# Patient Record
Sex: Male | Born: 1975 | Race: White | Hispanic: No | Marital: Married | Smoking: Never smoker
Health system: Southern US, Community
[De-identification: ages and names within clinical notes are randomized; demographics above are authoritative.]

## PROBLEM LIST (undated history)

## (undated) DIAGNOSIS — I639 Cerebral infarction, unspecified: Secondary | ICD-10-CM

## (undated) DIAGNOSIS — Z8489 Family history of other specified conditions: Secondary | ICD-10-CM

## (undated) DIAGNOSIS — I7771 Dissection of carotid artery: Secondary | ICD-10-CM

## (undated) DIAGNOSIS — Q249 Congenital malformation of heart, unspecified: Secondary | ICD-10-CM

## (undated) DIAGNOSIS — M1990 Unspecified osteoarthritis, unspecified site: Secondary | ICD-10-CM

## (undated) DIAGNOSIS — M069 Rheumatoid arthritis, unspecified: Secondary | ICD-10-CM

## (undated) DIAGNOSIS — E785 Hyperlipidemia, unspecified: Secondary | ICD-10-CM

## (undated) HISTORY — DX: Unspecified osteoarthritis, unspecified site: M19.90

---

## 2014-10-12 ENCOUNTER — Encounter (HOSPITAL_COMMUNITY): Payer: Self-pay | Admitting: *Deleted

## 2014-10-12 ENCOUNTER — Emergency Department (HOSPITAL_COMMUNITY)
Admission: EM | Admit: 2014-10-12 | Discharge: 2014-10-12 | Disposition: A | Discharge status: Home / Self Care | Payer: 59 | Source: Home / Self Care | Attending: Family Medicine | Admitting: Family Medicine

## 2014-10-12 DIAGNOSIS — R29898 Other symptoms and signs involving the musculoskeletal system: Secondary | ICD-10-CM | POA: Diagnosis not present

## 2014-10-12 DIAGNOSIS — R200 Anesthesia of skin: Secondary | ICD-10-CM | POA: Diagnosis not present

## 2014-10-12 NOTE — ED Provider Notes (Signed)
CSN: [id number]     Arrival date & time 10/12/14  8469 History   First MD Initiated Contact with Patient 10/12/14 [phone]     Chief Complaint  Patient presents with  . Arm Swelling   (Consider location/radiation/quality/duration/timing/severity/associated sxs/prior Treatment) HPI             39 year old male with no past medical history, on the medications, presents for evaluation of possible right arm injury. On Monday he noticed a small knot in his right distal biceps. Yesterday the area started to look bruised. This morning he woke up and his hand was ice cold and his fingers were numb, and he cannot move his hand. Exam is not as cold but his fingers are still numb. He still cannot move his fingers very much. Denies any injury. There is very little pain associated with this. Denies any heavy lifting  History reviewed. No pertinent past medical history. History reviewed. No pertinent past surgical history. History reviewed. No pertinent family history. History  Substance Use Topics  . Smoking status: Never Smoker   . Smokeless tobacco: Not on file  . Alcohol Use: No    Review of Systems  Musculoskeletal:       See history of present illness  Skin: Positive for color change.  Neurological: Positive for weakness and numbness.  All other systems reviewed and are negative.   Allergies  Review of patient's allergies indicates not on file.  Home Medications   Prior to Admission medications   Not on File   BP 133/85 mmHg  Pulse 75  Temp(Src) 97.9 F (36.6 C) (Oral)  Resp 12  SpO2 97% Physical Exam  Constitutional: He is oriented to person, place, and time. He appears well-developed and well-nourished. No distress.  HENT:  Head: Normocephalic.  Cardiovascular:  Pulses:      Radial pulses are 2+ on the right side.  Ulnar artery pulses normal   Pulmonary/Chest: Effort normal. No respiratory distress.  Musculoskeletal:       Right hand: He exhibits no tenderness, normal  capillary refill and no swelling. Decreased sensation (decreased sensation in lateral 3 fingers ) noted. Decreased strength noted. He exhibits thumb/finger opposition. He exhibits no finger abduction and no wrist extension trouble.  Decreased wrist flexion, decreased grip strength.  Ecchymosis over distal biceps, 4 cm diameter  Neurological: He is alert and oriented to person, place, and time. Coordination normal.  Skin: Skin is warm and dry. No rash noted. He is not diaphoretic.  Psychiatric: He has a normal mood and affect. Judgment normal.  Nursing note and vitals reviewed.   ED Course  Procedures (including critical care time) Labs Review Labs Reviewed - No data to display  Imaging Review No results found.  US performed by Dr. Denyse Amass reveals no obvious abnormality   MDM   1. Arm weakness   2. Finger numbness    Discussed this patient with the on-call hand surgeon who would like to see the patient in follow-up clinic. The patient is amenable to this plan. He may follow-up if worsening between then and now    Graylon Good, PA-C 10/12/14 1027

## 2014-10-12 NOTE — Discharge Instructions (Signed)
Please call Dr. Glenna Durand office to schedule a follow-up appointment when you leave here today  Paresthesia Paresthesia is an abnormal burning or prickling sensation. This sensation is generally felt in the hands, arms, legs, or feet. However, it may occur in any part of the body. It is usually not painful. The feeling may be described as:  Tingling or numbness.  "Pins and needles."  Skin crawling.  Buzzing.  Limbs "falling asleep."  Itching. Most people experience temporary (transient) paresthesia at some time in their lives. CAUSES  Paresthesia may occur when you breathe too quickly (hyperventilation). It can also occur without any apparent cause. Commonly, paresthesia occurs when pressure is placed on a nerve. The feeling quickly goes away once the pressure is removed. For some people, however, paresthesia is a long-lasting (chronic) condition caused by an underlying disorder. The underlying disorder may be:  A traumatic, direct injury to nerves. Examples include a:  Broken (fractured) neck.  Fractured skull.  A disorder affecting the brain and spinal cord (central nervous system). Examples include:  Transverse myelitis.  Encephalitis.  Transient ischemic attack.  Multiple sclerosis.  Stroke.  Tumor or blood vessel problems, such as an arteriovenous malformation pressing against the brain or spinal cord.  A condition that damages the peripheral nerves (peripheral neuropathy). Peripheral nerves are not part of the brain and spinal cord. These conditions include:  Diabetes.  Peripheral vascular disease.  Nerve entrapment syndromes, such as carpal tunnel syndrome.  Shingles.  Hypothyroidism.  Vitamin B12 deficiencies.  Alcoholism.  Heavy metal poisoning (lead, arsenic).  Rheumatoid arthritis.  Systemic lupus erythematosus. DIAGNOSIS  Your caregiver will attempt to find the underlying cause of your paresthesia. Your caregiver may:  Take your medical  history.  Perform a physical exam.  Order various lab tests.  Order imaging tests. TREATMENT  Treatment for paresthesia depends on the underlying cause. HOME CARE INSTRUCTIONS  Avoid drinking alcohol.  You may consider massage or acupuncture to help relieve your symptoms.  Keep all follow-up appointments as directed by your caregiver. SEEK IMMEDIATE MEDICAL CARE IF:   You feel weak.  You have trouble walking or moving.  You have problems with speech or vision.  You feel confused.  You cannot control your bladder or bowel movements.  You feel numbness after an injury.  You faint.  Your burning or prickling feeling gets worse when walking.  You have pain, cramps, or dizziness.  You develop a rash. MAKE SURE YOU:  Understand these instructions.  Will watch your condition.  Will get help right away if you are not doing well or get worse. Document Released: 07/05/2002 Document Revised: 10/07/2011 Document Reviewed: 04/05/2011 [facility] Patient Information 2015 [place], [place]. This information is not intended to replace advice given to you by your health care provider. Make sure you discuss any questions you have with your health care provider.

## 2014-10-12 NOTE — ED Notes (Signed)
Pt  Reports noticed  A  Knot  r    Forearm   2  Days   Ago   -  Yesterday  He  Noticed     Bruising   And  Swelling    -  He  denys  Any  Injury       He     Reports    Some  Tingling in  The  Affected  Hand   He  Has  Good  Radial  Pulse     -      He      Is  Sitting  Upright on  The  Exam  Table  Speaking  In  Complete  sentances

## 2014-10-12 NOTE — ED Provider Notes (Signed)
Limited ultrasound of the right flexor elbow: Area of ecchymosis is ultrasound without any obvious myofascial disruption. The distal biceps tendon was seen and was appeared to be intact. The distal insertion was not visualized. The neurovascular bundle was seen and had Doppler flow on PDI. The median nerve was identified and appeared to be intact.   Rodolph Bong, MD 10/12/14 1009

## 2015-07-18 ENCOUNTER — Encounter: Payer: Self-pay | Admitting: Internal Medicine

## 2015-07-18 ENCOUNTER — Ambulatory Visit: Payer: 59 | Admitting: Internal Medicine

## 2015-07-18 DIAGNOSIS — M069 Rheumatoid arthritis, unspecified: Secondary | ICD-10-CM

## 2015-07-18 DIAGNOSIS — R7612 Nonspecific reaction to cell mediated immunity measurement of gamma interferon antigen response without active tuberculosis: Secondary | ICD-10-CM | POA: Insufficient documentation

## 2015-07-18 HISTORY — DX: Rheumatoid arthritis, unspecified: M06.9

## 2016-04-04 ENCOUNTER — Other Ambulatory Visit: Payer: Self-pay

## 2016-04-04 ENCOUNTER — Emergency Department (HOSPITAL_COMMUNITY): Payer: 59

## 2016-04-04 ENCOUNTER — Inpatient Hospital Stay (HOSPITAL_COMMUNITY)
Admission: EM | Admit: 2016-04-04 | Discharge: 2016-04-05 | DRG: 064 | Disposition: A | Discharge status: Home / Self Care | Payer: 59 | Attending: Internal Medicine | Admitting: Internal Medicine

## 2016-04-04 ENCOUNTER — Encounter (HOSPITAL_COMMUNITY): Payer: Self-pay

## 2016-04-04 DIAGNOSIS — I639 Cerebral infarction, unspecified: Secondary | ICD-10-CM | POA: Insufficient documentation

## 2016-04-04 DIAGNOSIS — Q249 Congenital malformation of heart, unspecified: Secondary | ICD-10-CM

## 2016-04-04 DIAGNOSIS — R402142 Coma scale, eyes open, spontaneous, at arrival to emergency department: Secondary | ICD-10-CM | POA: Diagnosis present

## 2016-04-04 DIAGNOSIS — Z79899 Other long term (current) drug therapy: Secondary | ICD-10-CM

## 2016-04-04 DIAGNOSIS — R202 Paresthesia of skin: Secondary | ICD-10-CM | POA: Diagnosis present

## 2016-04-04 DIAGNOSIS — Z823 Family history of stroke: Secondary | ICD-10-CM

## 2016-04-04 DIAGNOSIS — E669 Obesity, unspecified: Secondary | ICD-10-CM | POA: Diagnosis present

## 2016-04-04 DIAGNOSIS — I6789 Other cerebrovascular disease: Secondary | ICD-10-CM | POA: Diagnosis not present

## 2016-04-04 DIAGNOSIS — I63412 Cerebral infarction due to embolism of left middle cerebral artery: Secondary | ICD-10-CM | POA: Diagnosis present

## 2016-04-04 DIAGNOSIS — E785 Hyperlipidemia, unspecified: Secondary | ICD-10-CM | POA: Diagnosis present

## 2016-04-04 DIAGNOSIS — R402362 Coma scale, best motor response, obeys commands, at arrival to emergency department: Secondary | ICD-10-CM | POA: Diagnosis present

## 2016-04-04 DIAGNOSIS — R4701 Aphasia: Secondary | ICD-10-CM | POA: Diagnosis present

## 2016-04-04 DIAGNOSIS — R29701 NIHSS score 1: Secondary | ICD-10-CM | POA: Diagnosis present

## 2016-04-04 DIAGNOSIS — R402242 Coma scale, best verbal response, confused conversation, at arrival to emergency department: Secondary | ICD-10-CM | POA: Diagnosis present

## 2016-04-04 DIAGNOSIS — Z6835 Body mass index (BMI) 35.0-35.9, adult: Secondary | ICD-10-CM | POA: Diagnosis not present

## 2016-04-04 DIAGNOSIS — M05711 Rheumatoid arthritis with rheumatoid factor of right shoulder without organ or systems involvement: Secondary | ICD-10-CM

## 2016-04-04 DIAGNOSIS — M069 Rheumatoid arthritis, unspecified: Secondary | ICD-10-CM | POA: Diagnosis present

## 2016-04-04 DIAGNOSIS — I7771 Dissection of carotid artery: Secondary | ICD-10-CM | POA: Diagnosis present

## 2016-04-04 DIAGNOSIS — I454 Nonspecific intraventricular block: Secondary | ICD-10-CM | POA: Diagnosis present

## 2016-04-04 DIAGNOSIS — Q211 Atrial septal defect: Secondary | ICD-10-CM | POA: Diagnosis not present

## 2016-04-04 HISTORY — DX: Congenital malformation of heart, unspecified: Q24.9

## 2016-04-04 HISTORY — DX: Hyperlipidemia, unspecified: E78.5

## 2016-04-04 HISTORY — DX: Cerebral infarction, unspecified: I63.9

## 2016-04-04 HISTORY — DX: Rheumatoid arthritis, unspecified: M06.9

## 2016-04-04 HISTORY — DX: Dissection of carotid artery: I77.71

## 2016-04-04 LAB — CBC
HEMATOCRIT: 46.8 % (ref 39.0–52.0)
Hemoglobin: 16.1 g/dL (ref 13.0–17.0)
MCH: 31.8 pg (ref 26.0–34.0)
MCHC: 34.4 g/dL (ref 30.0–36.0)
MCV: 92.3 fL (ref 78.0–100.0)
PLATELETS: 258 10*3/uL (ref 150–400)
RBC: 5.07 MIL/uL (ref 4.22–5.81)
RDW: 13.2 % (ref 11.5–15.5)
WBC: 7.4 10*3/uL (ref 4.0–10.5)

## 2016-04-04 LAB — I-STAT CHEM 8, ED
BUN: 18 mg/dL (ref 6–20)
CALCIUM ION: 1.12 mmol/L — AB (ref 1.15–1.40)
Chloride: 108 mmol/L (ref 101–111)
Creatinine, Ser: 1 mg/dL (ref 0.61–1.24)
GLUCOSE: 108 mg/dL — AB (ref 65–99)
HCT: 47 % (ref 39.0–52.0)
Hemoglobin: 16 g/dL (ref 13.0–17.0)
Potassium: 4.3 mmol/L (ref 3.5–5.1)
SODIUM: 143 mmol/L (ref 135–145)
TCO2: 23 mmol/L (ref 0–100)

## 2016-04-04 LAB — COMPREHENSIVE METABOLIC PANEL
ALK PHOS: 57 U/L (ref 38–126)
ALT: 91 U/L — AB (ref 17–63)
AST: 42 U/L — ABNORMAL HIGH (ref 15–41)
Albumin: 3.9 g/dL (ref 3.5–5.0)
Anion gap: 10 (ref 5–15)
BILIRUBIN TOTAL: 0.4 mg/dL (ref 0.3–1.2)
BUN: 15 mg/dL (ref 6–20)
CALCIUM: 9.2 mg/dL (ref 8.9–10.3)
CO2: 22 mmol/L (ref 22–32)
CREATININE: 1.07 mg/dL (ref 0.61–1.24)
Chloride: 109 mmol/L (ref 101–111)
Glucose, Bld: 114 mg/dL — ABNORMAL HIGH (ref 65–99)
Potassium: 4.4 mmol/L (ref 3.5–5.1)
SODIUM: 141 mmol/L (ref 135–145)
TOTAL PROTEIN: 7 g/dL (ref 6.5–8.1)

## 2016-04-04 LAB — RAPID URINE DRUG SCREEN, HOSP PERFORMED
AMPHETAMINES: NOT DETECTED
Barbiturates: NOT DETECTED
Benzodiazepines: NOT DETECTED
Cocaine: NOT DETECTED
OPIATES: NOT DETECTED
Tetrahydrocannabinol: NOT DETECTED

## 2016-04-04 LAB — TROPONIN I
Troponin I: <0.03 ng/mL (ref <0.03)
Troponin I: <0.03 ng/mL (ref <0.03)

## 2016-04-04 LAB — PROTIME-INR
INR: 1
INR: 1.07
Prothrombin Time: 13.2 seconds (ref 11.4–15.2)
Prothrombin Time: 13.9 seconds (ref 11.4–15.2)

## 2016-04-04 LAB — ETHANOL: Alcohol, Ethyl (B): <5 mg/dL (ref <5)

## 2016-04-04 LAB — URINALYSIS, ROUTINE W REFLEX MICROSCOPIC
BILIRUBIN URINE: NEGATIVE
Glucose, UA: NEGATIVE mg/dL
Hgb urine dipstick: NEGATIVE
KETONES UR: NEGATIVE mg/dL
LEUKOCYTES UA: NEGATIVE
NITRITE: NEGATIVE
Protein, ur: NEGATIVE mg/dL
SPECIFIC GRAVITY, URINE: 1.01 (ref 1.005–1.030)
pH: 6 (ref 5.0–8.0)

## 2016-04-04 LAB — DIFFERENTIAL
Basophils Absolute: 0.1 10*3/uL (ref 0.0–0.1)
Basophils Relative: 1 %
EOS PCT: 3 %
Eosinophils Absolute: 0.2 10*3/uL (ref 0.0–0.7)
LYMPHS ABS: 2.8 10*3/uL (ref 0.7–4.0)
LYMPHS PCT: 38 %
MONO ABS: 0.7 10*3/uL (ref 0.1–1.0)
MONOS PCT: 9 %
NEUTROS ABS: 3.6 10*3/uL (ref 1.7–7.7)
Neutrophils Relative %: 49 %

## 2016-04-04 LAB — ANTITHROMBIN III: ANTITHROMB III FUNC: 81 % (ref 75–120)

## 2016-04-04 LAB — SEDIMENTATION RATE: SED RATE: 3 mm/h (ref 0–16)

## 2016-04-04 LAB — APTT: aPTT: 29 seconds (ref 24–36)

## 2016-04-04 LAB — I-STAT TROPONIN, ED: TROPONIN I, POC: 0 ng/mL (ref 0.00–0.08)

## 2016-04-04 MED ORDER — STROKE: EARLY STAGES OF RECOVERY BOOK
Freq: Once | Status: DC
  Filled 2016-04-04 (×2): qty 1

## 2016-04-04 MED ORDER — SENNOSIDES-DOCUSATE SODIUM 8.6-50 MG PO TABS: 1.0000 | ORAL_TABLET | Freq: Every evening | ORAL | Status: DC | PRN

## 2016-04-04 MED ORDER — IOPAMIDOL (ISOVUE-300) INJECTION 61%
INTRAVENOUS | Status: DC
Start: 2016-04-04 — End: 2016-04-04
  Filled 2016-04-04: qty 150

## 2016-04-04 MED ORDER — NITROGLYCERIN 1 MG/10 ML FOR IR/CATH LAB
INTRA_ARTERIAL | Status: AC
  Filled 2016-04-04: qty 10

## 2016-04-04 MED ORDER — LIDOCAINE HCL 1 % IJ SOLN
INTRAMUSCULAR | Status: AC
  Filled 2016-04-04: qty 20

## 2016-04-04 MED ORDER — IOPAMIDOL (ISOVUE-370) INJECTION 76%
INTRAVENOUS | Status: AC
  Administered 2016-04-04: 40 mL via INTRAVENOUS
  Filled 2016-04-04: qty 100

## 2016-04-04 MED ORDER — SODIUM CHLORIDE 0.9 % IV SOLN
INTRAVENOUS | Status: DC
  Administered 2016-04-04: 12:00:00 via INTRAVENOUS

## 2016-04-04 MED ORDER — ASPIRIN EC 325 MG PO TBEC
325.0000 mg | DELAYED_RELEASE_TABLET | Freq: Every day | ORAL | Status: DC
  Administered 2016-04-04 – 2016-04-05 (×2): 325 mg via ORAL
  Filled 2016-04-04 (×2): qty 1

## 2016-04-04 MED ORDER — ENOXAPARIN SODIUM 40 MG/0.4ML ~~LOC~~ SOLN
40.0000 mg | SUBCUTANEOUS | Status: DC
  Administered 2016-04-04 – 2016-04-05 (×2): 40 mg via SUBCUTANEOUS
  Filled 2016-04-04 (×2): qty 0.4

## 2016-04-04 NOTE — Progress Notes (Addendum)
Pt c/o increase numbness and decrease sensation of his right arm. Strength normal/strong. VSS. MD paged of concern. No other distress voice.  Neuro called back and for RN to report if sx doesn't go away in an hour.   Sim Boast, RN

## 2016-04-04 NOTE — Consult Note (Addendum)
Chief Complaint: code stroke  History obtained from:  Patient     HPI:                                                                                                                                         Hunter Wilson is an  40 y.o. male with hx of RA on humara who presents with waking up with R arm tingling and numbness followed by word find difficulties.  Date last known well: 04/03/16 Time last known well: last night before bed tPA Given: No: wake up stroke  Past Medical History:  Diagnosis Date  . Arthritis     No past surgical history on file.  No family history on file. Social History:  reports that he has never smoked. He does not have any smokeless tobacco history on file. He reports that he does not drink alcohol. His drug history is not on file.  Allergies: Not on File  Medications:                                                                                                                           I have reviewed the patient's current medications.  ROS:                                                                                                                                       History obtained from chart review and the patient  General ROS: negative for - chills, fatigue, fever, night sweats, weight gain or weight loss Psychological ROS: negative for - behavioral disorder, hallucinations, memory difficulties, mood swings or suicidal ideation Ophthalmic ROS: negative for - blurry vision, double vision, eye pain or loss of vision ENT ROS: negative for - epistaxis, nasal discharge, oral lesions, sore  throat, tinnitus or vertigo Allergy and Immunology ROS: negative for - hives or itchy/watery eyes Hematological and Lymphatic ROS: negative for - bleeding problems, bruising or swollen lymph nodes Endocrine ROS: negative for - galactorrhea, hair pattern changes, polydipsia/polyuria or temperature intolerance Respiratory ROS: negative for - cough,  hemoptysis, shortness of breath or wheezing Cardiovascular ROS: negative for - chest pain, dyspnea on exertion, edema or irregular heartbeat Gastrointestinal ROS: negative for - abdominal pain, diarrhea, hematemesis, nausea/vomiting or stool incontinence Genito-Urinary ROS: negative for - dysuria, hematuria, incontinence or urinary frequency/urgency Musculoskeletal ROS: negative for - joint swelling or muscular weakness Neurological ROS: as noted in HPI Dermatological ROS: negative for rash and skin lesion changes  Neurologic Examination:                                                                                                      Weight 112.1 kg (247 lb 2.2 oz).  HEENT-  Normocephalic, no lesions, without obvious abnormality.  Normal external eye and conjunctiva.  Normal TM's bilaterally.  Normal auditory canals and external ears. Normal external nose, mucus membranes and septum.  Normal pharynx. Cardiovascular- regular rate and rhythm, S1, S2 normal, no murmur, click, rub or gallop, pulses palpable throughout   Lungs- chest clear, no wheezing, rales, normal symmetric air entry, Heart exam - S1, S2 normal, no murmur, no gallop, rate regular Abdomen- soft, non-tender; bowel sounds normal; no masses,  no organomegaly   Neurological Examination Mental Status: Alert, oriented, thought content appropriate.  Speech fluent with mild word finding difficulties .  Able to follow 3 step commands without difficulty. Cranial Nerves: II: Visual fields grossly normal, pupils equal, round, reactive to light and accommodation III,IV, VI: ptosis not present, extra-ocular motions intact bilaterally V,VII: smile symmetric, facial light touch sensation normal bilaterally VIII: hearing normal bilaterally IX,X: uvula rises symmetrically XI: bilateral shoulder shrug XII: midline tongue extension Motor: Right : Upper extremity   5/5    Left:     Upper extremity   5/5  Lower extremity   5/5     Lower  extremity   5/5 Tone and bulk:normal tone throughout; no atrophy noted Sensory: Pinprick and light touch intact throughout, bilaterally Cerebellar: normal finger-to-nose  NIHSS 1        Lab Results: Basic Metabolic Panel:  Recent Labs Lab 04/04/16 0804 04/04/16 0813  NA 141 143  K 4.4 4.3  CL 109 108  CO2 22  --   GLUCOSE 114* 108*  BUN 15 18  CREATININE 1.07 1.00  CALCIUM 9.2  --     Liver Function Tests:  Recent Labs Lab 04/04/16 0804  AST 42*  ALT 91*  ALKPHOS 57  BILITOT 0.4  PROT 7.0  ALBUMIN 3.9   No results for input(s): LIPASE, AMYLASE in the last 168 hours. No results for input(s): AMMONIA in the last 168 hours.  CBC:  Recent Labs Lab 04/04/16 0804 04/04/16 0813  WBC 7.4  --   NEUTROABS 3.6  --   HGB 16.1 16.0  HCT 46.8 47.0  MCV 92.3  --  PLT 258  --     Cardiac Enzymes: No results for input(s): CKTOTAL, CKMB, CKMBINDEX, TROPONINI in the last 168 hours.  Lipid Panel: No results for input(s): CHOL, TRIG, HDL, CHOLHDL, VLDL, LDLCALC in the last 168 hours.  CBG: No results for input(s): GLUCAP in the last 168 hours.  Microbiology: No results found for this or any previous visit.  Coagulation Studies:  Recent Labs  04/04/16 0804  LABPROT 13.2  INR 1.00    Imaging: Ct Head Code Stroke W/o Cm  Result Date: 04/04/2016 CLINICAL DATA:  Code stroke. Right-sided tingling. Speech difficulty. Stroke. EXAM: CT HEAD WITHOUT CONTRAST TECHNIQUE: Contiguous axial images were obtained from the base of the skull through the vertex without intravenous contrast. COMPARISON:  None. FINDINGS: Ventricle size normal. Negative for acute infarct. Negative for hemorrhage or mass. No edema identified. Negative calvarium. ASPECTS [facility] Stroke Program Early CT Score) - Ganglionic level infarction (caudate, lentiform nuclei, internal capsule, insula, M1-M3 cortex): 7 - Supraganglionic infarction (M4-M6 cortex): 3 Total score (0-10 with 10 being normal):  10 IMPRESSION: 1. Negative CT head 2. ASPECTS is 10 These results were called by telephone at the time of interpretation on 04/04/2016 at 8:18 am to Dr. Dorothy Spark, who verbally acknowledged these results. Electronically Signed   By: Marlan Palau M.D.   On: 04/04/2016 08:18        Assessment: 40 y.o. male with hx of RA on humara who presents with waking up with R arm tingling and numbness followed by word find difficulties.  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

## 2016-04-04 NOTE — Evaluation (Signed)
Physical Therapy Evaluation Patient Details Name: Hunter Wilson MRN: [id number] DOB: 08-14-1975 Today's Date: 04/04/2016   History of Present Illness  Patient is a 40 yo male admitted 04/04/16 with RUE tingling/numbness and impaired speech.   PMH:  Arthritis  Clinical Impression  Patient is functioning at independent level with mobility and gait.  Good balance during gait, scoring 24/24 on DGI balance assessment.  No acute PT needs identified - PT will sign off.    Follow Up Recommendations No PT follow up;Supervision - Intermittent    Equipment Recommendations  None recommended by PT    Recommendations for Other Services       Precautions / Restrictions Precautions Precautions: None Restrictions Weight Bearing Restrictions: No      Mobility  Bed Mobility               General bed mobility comments: Patient in chair  Transfers Overall transfer level: Independent Equipment used: None                Ambulation/Gait Ambulation/Gait assistance: Independent Ambulation Distance (Feet): 200 Feet Assistive device: None Gait Pattern/deviations: WFL(Within Functional Limits);Step-through pattern   Gait velocity interpretation: at or above normal speed for age/gender General Gait Details: Patient with good gait pattern, balance, and velocity.  No loss of balance during session.  Stairs Stairs: Yes Stairs assistance: Supervision Stair Management: No rails;Alternating pattern;Forwards Number of Stairs: 4 General stair comments: Patient able to negotiate stairs with supervision for safety only.  Wheelchair Mobility    Modified Rankin (Stroke Patients Only) Modified Rankin (Stroke Patients Only) Pre-Morbid Rankin Score: No symptoms Modified Rankin: No significant disability     Balance Overall balance assessment: Independent                               Standardized Balance Assessment Standardized Balance Assessment : Dynamic Gait Index    Dynamic Gait Index Level Surface: Normal Change in Gait Speed: Normal Gait with Horizontal Head Turns: Normal Gait with Vertical Head Turns: Normal Gait and Pivot Turn: Normal Step Over Obstacle: Normal Step Around Obstacles: Normal Steps: Normal Total Score: 24       Pertinent Vitals/Pain Pain Assessment: No/denies pain    Home Living Family/patient expects to be discharged to:: Private residence Living Arrangements: Spouse/significant other;Children Available Help at Discharge: Family;Available 24 hours/day Type of Home: House Home Access: Stairs to enter Entrance Stairs-Rails: None Entrance Stairs-Number of Steps: 2 Home Layout: One level Home Equipment: None      Prior Function Level of Independence: Independent         Comments: Works full time     Higher education careers adviser   Dominant Hand: Right    Extremity/Trunk Assessment   Upper Extremity Assessment: RUE deficits/detail;Defer to OT evaluation           Lower Extremity Assessment: Overall WFL for tasks assessed (Symmetrical strength and sensation)      Cervical / Trunk Assessment: Normal  Communication   Communication: Expressive difficulties  Cognition Arousal/Alertness: Awake/alert Behavior During Therapy: WFL for tasks assessed/performed Overall Cognitive Status: Within Functional Limits for tasks assessed                      General Comments      Exercises        Assessment/Plan    PT Assessment Patent does not need any further PT services  PT Diagnosis Abnormality of gait   PT  Problem List    PT Treatment Interventions     PT Goals (Current goals can be found in the Care Plan section) Acute Rehab PT Goals PT Goal Formulation: All assessment and education complete, DC therapy    Frequency     Barriers to discharge        Co-evaluation               End of Session   Activity Tolerance: Patient tolerated treatment well Patient left: in chair;with  family/visitor present Nurse Communication: Mobility status (Independent with gait.  No PT needs)         Time: 1151-1202 PT Time Calculation (min) (ACUTE ONLY): 11 min   Charges:   PT Evaluation $PT Eval Low Complexity: 1 Procedure     PT G Codes:        Vena Austria 04-10-2016, 12:12 PM Durenda Hurt. Renaldo Fiddler, [facility] Acute Rehab Services Pager [phone]

## 2016-04-04 NOTE — ED Notes (Signed)
IR notified of patient's potential to be scanned.

## 2016-04-04 NOTE — ED Notes (Signed)
Neuro MD Devoshire at the bedside speaking with patient about plan of care.

## 2016-04-04 NOTE — H&P (Signed)
[facility] History and Physical  Hunter Wilson INO:[id number] DOB: February 24, 1976 DOA: 04/04/2016    PCP: No primary care provider on file.   Chief Complaint: Acute CVA   HPI:  40 y.o. male with hx of RA on humara who presents with waking up with R arm tingling and numbness followed by word find difficulties. Patient was last seen normal before going to bed. Brought in as a code stroke at 8 AM this morning via EMS . Patient evaluated by neurology and sent for a stat MRI. Patient had aphasia initially followed by word finding difficulty.MRI brain done, does appear to have a few scattered L MCA territory acute infracts. Patient subsequently had a CT angiography head and neck, which showed a focal filling defect at the origin of the left internal carotid artery concerning for a dissection flap. The more distal cervical left ICA was normal.Dr. Estanislado Pandy was then consulted for angiogram. Patient and wife declined catheter angiogram.  Patient to be admitted for stroke work-up.       Review of Systems: negative for the following  Constitutional: Denies fever, chills, diaphoresis, appetite change and fatigue.  HEENT: Denies photophobia, eye pain, redness, hearing loss, ear pain, congestion, sore throat, rhinorrhea, sneezing, mouth sores, trouble swallowing, neck pain, neck stiffness and tinnitus.  Respiratory: Denies SOB, DOE, cough, chest tightness, and wheezing.  Cardiovascular: Denies chest pain, palpitations and leg swelling.  Gastrointestinal: Denies nausea, vomiting, abdominal pain, diarrhea, constipation, blood in stool and abdominal distention.  Genitourinary: Denies dysuria, urgency, frequency, hematuria, flank pain and difficulty urinating.  Musculoskeletal: Denies myalgias, back pain, joint swelling, arthralgias and gait problem.  Skin: Denies pallor, rash and wound.  Neurological: Positive for speech difficulty and numbnessHematological: Denies adenopathy. Easy bruising, personal or  family bleeding history  Psychiatric/Behavioral: Denies suicidal ideation, mood changes, confusion, nervousness, sleep disturbance and agitation       Past Medical History:  Diagnosis Date  . Arthritis      History reviewed. No pertinent surgical history.    Social History:  reports that he has never smoked. He has never used smokeless tobacco. He reports that he does not drink alcohol or use drugs.    No Known Allergies  Family history Denies family history of CVA, hypertension, dyslipidemia, diabetes      Prior to Admission medications   Not on File     Physical Exam: Vitals:   04/04/16 0930 04/04/16 0945 04/04/16 1000 04/04/16 1012  BP: 155/82 146/90 138/92   Pulse: 73 85 68   Resp: '21 19 14   ' Temp: 98 F (36.7 C)   98 F (36.7 C)  TempSrc: Oral     SpO2: 100% 100% 100%   Weight: 112.1 kg (247 lb 2.2 oz)     Height: '5\' 10"'  (1.778 m)         Constitutional: NAD, calm, comfortable Vitals:   04/04/16 0930 04/04/16 0945 04/04/16 1000 04/04/16 1012  BP: 155/82 146/90 138/92   Pulse: 73 85 68   Resp: '21 19 14   ' Temp: 98 F (36.7 C)   98 F (36.7 C)  TempSrc: Oral     SpO2: 100% 100% 100%   Weight: 112.1 kg (247 lb 2.2 oz)     Height: '5\' 10"'  (1.778 m)      Eyes: PERRL, lids and conjunctivae normal ENMT: Mucous membranes are moist. Posterior pharynx clear of any exudate or lesions.Normal dentition.  Neck: normal, supple, no masses, no thyromegaly Respiratory: clear to auscultation bilaterally, no wheezing,  no crackles. Normal respiratory effort. No accessory muscle use.  Cardiovascular: Regular rate and rhythm, no murmurs / rubs / gallops. No extremity edema. 2+ pedal pulses. No carotid bruits.  Abdomen: no tenderness, no masses palpated. No hepatosplenomegaly. Bowel sounds positive.  Musculoskeletal: no clubbing / cyanosis. No joint deformity upper and lower extremities. Good ROM, no contractures. Normal muscle tone.  Skin: no rashes, lesions, ulcers.  No induration Neurologic: Speech fluent with mild word finding difficulties ,CN 2-12 grossly intact. Sensation intact, DTR normal. Strength 5/5 in all 4.  Psychiatric: Normal judgment and insight. Alert and oriented x 3. Normal mood.     Labs on Admission: I have personally reviewed following labs and imaging studies  CBC:  Recent Labs Lab 04/04/16 0804 04/04/16 0813  WBC 7.4  --   NEUTROABS 3.6  --   HGB 16.1 16.0  HCT 46.8 47.0  MCV 92.3  --   PLT 258  --     Basic Metabolic Panel:  Recent Labs Lab 04/04/16 0804 04/04/16 0813  NA 141 143  K 4.4 4.3  CL 109 108  CO2 22  --   GLUCOSE 114* 108*  BUN 15 18  CREATININE 1.07 1.00  CALCIUM 9.2  --     GFR: Estimated Creatinine Clearance: 121.8 mL/min (by C-G formula based on SCr of 1 mg/dL).  Liver Function Tests:  Recent Labs Lab 04/04/16 0804  AST 42*  ALT 91*  ALKPHOS 57  BILITOT 0.4  PROT 7.0  ALBUMIN 3.9   No results for input(s): LIPASE, AMYLASE in the last 168 hours. No results for input(s): AMMONIA in the last 168 hours.  Coagulation Profile:  Recent Labs Lab 04/04/16 0804  INR 1.00   No results for input(s): DDIMER in the last 72 hours.  Cardiac Enzymes: No results for input(s): CKTOTAL, CKMB, CKMBINDEX, TROPONINI in the last 168 hours.  BNP (last 3 results) No results for input(s): PROBNP in the last 8760 hours.  HbA1C: No results for input(s): HGBA1C in the last 72 hours. No results found for: HGBA1C   CBG: No results for input(s): GLUCAP in the last 168 hours.  Lipid Profile: No results for input(s): CHOL, HDL, LDLCALC, TRIG, CHOLHDL, LDLDIRECT in the last 72 hours.  Thyroid Function Tests: No results for input(s): TSH, T4TOTAL, FREET4, T3FREE, THYROIDAB in the last 72 hours.  Anemia Panel: No results for input(s): VITAMINB12, FOLATE, FERRITIN, TIBC, IRON, RETICCTPCT in the last 72 hours.  Urine analysis: No results found for: COLORURINE, APPEARANCEUR, LABSPEC, PHURINE,  GLUCOSEU, HGBUR, BILIRUBINUR, KETONESUR, PROTEINUR, UROBILINOGEN, NITRITE, LEUKOCYTESUR  Sepsis Labs: '@[email or web address]' (procalcitonin:4,lacticidven:4) )No results found for this or any previous visit (from the past 240 hour(s)).       Radiological Exams on Admission: Ct Angio Head W Or Wo Contrast  Result Date: 04/04/2016 CLINICAL DATA:  Acute left MCA infarcts. New onset of right arm heaviness and numbness. Abnormal speech. Symptoms are improving. EXAM: CT ANGIOGRAPHY HEAD AND NECK TECHNIQUE: Multidetector CT imaging of the head and neck was performed using the standard protocol during bolus administration of intravenous contrast. Multiplanar CT image reconstructions and MIPs were obtained to evaluate the vascular anatomy. Carotid stenosis measurements (when applicable) are obtained utilizing NASCET criteria, using the distal internal carotid diameter as the denominator. CONTRAST:  60 mL Isovue 370 COMPARISON:  None. FINDINGS: CTA NECK Aortic arch: There is a common origin of the left common carotid artery and innominate artery. No significant atherosclerotic calcification or stenosis is present at the aortic arch. Right  carotid system: The right common carotid artery is within normal limits. The right carotid bifurcation is normal. The cervical right ICA is within normal limits. Left carotid system: The left common carotid artery is within normal limits. There is a focal filling defect at the origin of the left internal carotid artery concerning for a dissection flap. The more distal lumen is patent and without abnormality. The more distal cervical left ICA is normal. Vertebral arteries:The vertebral arteries are codominant. They both originate from the subclavian arteries. There are no significant stenoses or vascular abnormalities within the cervical vertebral arteries bilaterally. Skeleton: Endplate degenerative changes are present at multiple levels in the cervical spine. Asymmetric right-sided  uncovertebral disease in osseous foraminal narrowing is present at C3-4 and C6-7. Disease is more prominent on the left at C5-6. No focal lytic or blastic lesions are present. Other neck: The soft tissues the neck are otherwise unremarkable. No significant mucosal or submucosal lesions are present. The salivary glands are within normal limits. The thyroid is normal. Prominent posterior right level 3 lymph nodes and sub cm bilateral level 2 lymph nodes appear to be reactive. Upper chest: The lung apices are clear. CTA HEAD Anterior circulation: The internal carotid arteries are within normal limits from the skullbase through the ICA termini bilaterally. The A1 and M1 segments are normal. The anterior communicating artery is patent. The MCA bifurcations are intact. No significant branch vessel occlusion is evident. There is slight asymmetry to collateral vessels over the left convexity within the area of ischemia. Posterior circulation: The vertebral arteries are codominant. The PICA origins are visualized and normal. The basilar artery is normal. The right posterior cerebral artery is of fetal type. A small right P1 segment is present. The left posterior cerebral artery originates from the basilar tip. PCA branch vessels are within normal limits. Venous sinuses: The dural sinuses are patent. The right transverse sinus is dominant. The straight sinus and deep cerebral veins are intact. Cortical veins are unremarkable. Anatomic variants: Fetal type right posterior cerebral artery. Delayed phase: Not performed. IMPRESSION: 1. Focal filling defect at the origin of the left internal carotid artery is concerning for a dissection flap. 2. The more distal cervical left ICA is normal. 3. No significant proximal or mid MCA branch vessel occlusion is evident. 4. The right anterior and bilateral posterior circulation is normal. These results were called by telephone at the time of interpretation on 04/04/2016 at 10:01 am to Dr.  Norman Clay , who verbally acknowledged these results. Electronically Signed   By: San Morelle M.D.   On: 04/04/2016 10:13   Ct Angio Neck W Or Wo Contrast  Result Date: 04/04/2016 CLINICAL DATA:  Acute left MCA infarcts. New onset of right arm heaviness and numbness. Abnormal speech. Symptoms are improving. EXAM: CT ANGIOGRAPHY HEAD AND NECK TECHNIQUE: Multidetector CT imaging of the head and neck was performed using the standard protocol during bolus administration of intravenous contrast. Multiplanar CT image reconstructions and MIPs were obtained to evaluate the vascular anatomy. Carotid stenosis measurements (when applicable) are obtained utilizing NASCET criteria, using the distal internal carotid diameter as the denominator. CONTRAST:  60 mL Isovue 370 COMPARISON:  None. FINDINGS: CTA NECK Aortic arch: There is a common origin of the left common carotid artery and innominate artery. No significant atherosclerotic calcification or stenosis is present at the aortic arch. Right carotid system: The right common carotid artery is within normal limits. The right carotid bifurcation is normal. The cervical right ICA is within  normal limits. Left carotid system: The left common carotid artery is within normal limits. There is a focal filling defect at the origin of the left internal carotid artery concerning for a dissection flap. The more distal lumen is patent and without abnormality. The more distal cervical left ICA is normal. Vertebral arteries:The vertebral arteries are codominant. They both originate from the subclavian arteries. There are no significant stenoses or vascular abnormalities within the cervical vertebral arteries bilaterally. Skeleton: Endplate degenerative changes are present at multiple levels in the cervical spine. Asymmetric right-sided uncovertebral disease in osseous foraminal narrowing is present at C3-4 and C6-7. Disease is more prominent on the left at C5-6. No focal  lytic or blastic lesions are present. Other neck: The soft tissues the neck are otherwise unremarkable. No significant mucosal or submucosal lesions are present. The salivary glands are within normal limits. The thyroid is normal. Prominent posterior right level 3 lymph nodes and sub cm bilateral level 2 lymph nodes appear to be reactive. Upper chest: The lung apices are clear. CTA HEAD Anterior circulation: The internal carotid arteries are within normal limits from the skullbase through the ICA termini bilaterally. The A1 and M1 segments are normal. The anterior communicating artery is patent. The MCA bifurcations are intact. No significant branch vessel occlusion is evident. There is slight asymmetry to collateral vessels over the left convexity within the area of ischemia. Posterior circulation: The vertebral arteries are codominant. The PICA origins are visualized and normal. The basilar artery is normal. The right posterior cerebral artery is of fetal type. A small right P1 segment is present. The left posterior cerebral artery originates from the basilar tip. PCA branch vessels are within normal limits. Venous sinuses: The dural sinuses are patent. The right transverse sinus is dominant. The straight sinus and deep cerebral veins are intact. Cortical veins are unremarkable. Anatomic variants: Fetal type right posterior cerebral artery. Delayed phase: Not performed. IMPRESSION: 1. Focal filling defect at the origin of the left internal carotid artery is concerning for a dissection flap. 2. The more distal cervical left ICA is normal. 3. No significant proximal or mid MCA branch vessel occlusion is evident. 4. The right anterior and bilateral posterior circulation is normal. These results were called by telephone at the time of interpretation on 04/04/2016 at 10:01 am to Dr. Norman Clay , who verbally acknowledged these results. Electronically Signed   By: San Morelle M.D.   On: 04/04/2016 10:13    Mr Jodene Nam Head Wo Contrast  Result Date: 04/04/2016 CLINICAL DATA:  Awoke this morning with right-sided numbness, tingling and slurred speech. EXAM: MRI HEAD WITHOUT CONTRAST MRA HEAD WITHOUT CONTRAST TECHNIQUE: Multiplanar, multiecho pulse sequences of the brain and surrounding structures were obtained without intravenous contrast. Angiographic images of the head were obtained using MRA technique without contrast. COMPARISON:  Head CT same day FINDINGS: MRI HEAD FINDINGS Axial and coronal diffusion imaging only is performed. There are areas of acute infarction developing within the left MCA territory at the left posterior frontal cortical region an the left parietal cortical and subcortical region. No evidence of hemorrhage, swelling or mass effect. No hydrocephalus. No extra-axial collection. No visible changes of chronic ischemia. MRA HEAD FINDINGS Both internal carotid arteries are widely patent into the brain. No siphon stenosis. Right anterior and middle cerebral arteries are normal. Left anterior cerebral artery is normal. I think there is an occluded frontoparietal branch in the left MCA territory. Both vertebral arteries are widely patent to the basilar. No basilar  stenosis. Superior cerebellar and posterior cerebral arteries are patent, with the right PCA taking a fetal origin from the anterior circulation. IMPRESSION: Developing acute infarction in the left MCA territory. Diffusion abnormality is patchy in the left posterior frontal and left parietal region. No swelling or hemorrhage visible. Occluded left M2-M3 branch. Electronically Signed   By: Nelson Chimes M.D.   On: 04/04/2016 09:05   Ct Cerebral Perfusion W Contrast  Result Date: 04/04/2016 CLINICAL DATA:  New onset of heaviness and numbness in the right arm. Slurred speech. Symptoms are resolving. EXAM: CT CEREBRAL PERFUSION WITH CONTRAST TECHNIQUE: Tenuous imaging was performed through the brain during bolus injection of IV contrast. Para  metric maps were subsequently calculated using [employer]. CONTRAST:  40 mL Isovue 370 COMPARISON:  MRI brain from the same day. FINDINGS: An area of decreased profusion is evident with increased mean transit time and time to peak in the posterior left frontal lobe over the convexity. The more anterior left MCA territory is not involved. There scattered areas of core infarct at the periphery of this area. The majority is ischemic penumbra. Volume data is not currently available. IMPRESSION: 1. Prominent area of penumbra involving approximately 1/3 of the left MCA territory above the level of the operculum and sparing the anterior MCA territory with minimal peripheral core infarct territory. These results were called by telephone at the time of interpretation on 04/04/2016 at 10:01 am to Dr. Norman Clay , who verbally acknowledged these results. Electronically Signed   By: San Morelle M.D.   On: 04/04/2016 10:22   Mr Brain Ltd W/o Cm  Result Date: 04/04/2016 CLINICAL DATA:  Awoke this morning with right-sided numbness, tingling and slurred speech. EXAM: MRI HEAD WITHOUT CONTRAST MRA HEAD WITHOUT CONTRAST TECHNIQUE: Multiplanar, multiecho pulse sequences of the brain and surrounding structures were obtained without intravenous contrast. Angiographic images of the head were obtained using MRA technique without contrast. COMPARISON:  Head CT same day FINDINGS: MRI HEAD FINDINGS Axial and coronal diffusion imaging only is performed. There are areas of acute infarction developing within the left MCA territory at the left posterior frontal cortical region an the left parietal cortical and subcortical region. No evidence of hemorrhage, swelling or mass effect. No hydrocephalus. No extra-axial collection. No visible changes of chronic ischemia. MRA HEAD FINDINGS Both internal carotid arteries are widely patent into the brain. No siphon stenosis. Right anterior and middle cerebral arteries are normal.  Left anterior cerebral artery is normal. I think there is an occluded frontoparietal branch in the left MCA territory. Both vertebral arteries are widely patent to the basilar. No basilar stenosis. Superior cerebellar and posterior cerebral arteries are patent, with the right PCA taking a fetal origin from the anterior circulation. IMPRESSION: Developing acute infarction in the left MCA territory. Diffusion abnormality is patchy in the left posterior frontal and left parietal region. No swelling or hemorrhage visible. Occluded left M2-M3 branch. Electronically Signed   By: Nelson Chimes M.D.   On: 04/04/2016 09:05   Ct Head Code Stroke W/o Cm  Result Date: 04/04/2016 CLINICAL DATA:  Code stroke. Right-sided tingling. Speech difficulty. Stroke. EXAM: CT HEAD WITHOUT CONTRAST TECHNIQUE: Contiguous axial images were obtained from the base of the skull through the vertex without intravenous contrast. COMPARISON:  None. FINDINGS: Ventricle size normal. Negative for acute infarct. Negative for hemorrhage or mass. No edema identified. Negative calvarium. ASPECTS [facility] Stroke Program Early CT Score) - Ganglionic level infarction (caudate, lentiform nuclei, internal capsule, insula, M1-M3 cortex):  7 - Supraganglionic infarction (M4-M6 cortex): 3 Total score (0-10 with 10 being normal): 10 IMPRESSION: 1. Negative CT head 2. ASPECTS is 10 These results were called by telephone at the time of interpretation on 04/04/2016 at 8:18 am to Dr. Cammy Copa, who verbally acknowledged these results. Electronically Signed   By: Franchot Gallo M.D.   On: 04/04/2016 08:18   Ct Angio Head W Or Wo Contrast  Result Date: 04/04/2016 CLINICAL DATA:  Acute left MCA infarcts. New onset of right arm heaviness and numbness. Abnormal speech. Symptoms are improving. EXAM: CT ANGIOGRAPHY HEAD AND NECK TECHNIQUE: Multidetector CT imaging of the head and neck was performed using the standard protocol during bolus administration of intravenous  contrast. Multiplanar CT image reconstructions and MIPs were obtained to evaluate the vascular anatomy. Carotid stenosis measurements (when applicable) are obtained utilizing NASCET criteria, using the distal internal carotid diameter as the denominator. CONTRAST:  60 mL Isovue 370 COMPARISON:  None. FINDINGS: CTA NECK Aortic arch: There is a common origin of the left common carotid artery and innominate artery. No significant atherosclerotic calcification or stenosis is present at the aortic arch. Right carotid system: The right common carotid artery is within normal limits. The right carotid bifurcation is normal. The cervical right ICA is within normal limits. Left carotid system: The left common carotid artery is within normal limits. There is a focal filling defect at the origin of the left internal carotid artery concerning for a dissection flap. The more distal lumen is patent and without abnormality. The more distal cervical left ICA is normal. Vertebral arteries:The vertebral arteries are codominant. They both originate from the subclavian arteries. There are no significant stenoses or vascular abnormalities within the cervical vertebral arteries bilaterally. Skeleton: Endplate degenerative changes are present at multiple levels in the cervical spine. Asymmetric right-sided uncovertebral disease in osseous foraminal narrowing is present at C3-4 and C6-7. Disease is more prominent on the left at C5-6. No focal lytic or blastic lesions are present. Other neck: The soft tissues the neck are otherwise unremarkable. No significant mucosal or submucosal lesions are present. The salivary glands are within normal limits. The thyroid is normal. Prominent posterior right level 3 lymph nodes and sub cm bilateral level 2 lymph nodes appear to be reactive. Upper chest: The lung apices are clear. CTA HEAD Anterior circulation: The internal carotid arteries are within normal limits from the skullbase through the ICA  termini bilaterally. The A1 and M1 segments are normal. The anterior communicating artery is patent. The MCA bifurcations are intact. No significant branch vessel occlusion is evident. There is slight asymmetry to collateral vessels over the left convexity within the area of ischemia. Posterior circulation: The vertebral arteries are codominant. The PICA origins are visualized and normal. The basilar artery is normal. The right posterior cerebral artery is of fetal type. A small right P1 segment is present. The left posterior cerebral artery originates from the basilar tip. PCA branch vessels are within normal limits. Venous sinuses: The dural sinuses are patent. The right transverse sinus is dominant. The straight sinus and deep cerebral veins are intact. Cortical veins are unremarkable. Anatomic variants: Fetal type right posterior cerebral artery. Delayed phase: Not performed. IMPRESSION: 1. Focal filling defect at the origin of the left internal carotid artery is concerning for a dissection flap. 2. The more distal cervical left ICA is normal. 3. No significant proximal or mid MCA branch vessel occlusion is evident. 4. The right anterior and bilateral posterior circulation is normal. These  results were called by telephone at the time of interpretation on 04/04/2016 at 10:01 am to Dr. Norman Clay , who verbally acknowledged these results. Electronically Signed   By: San Morelle M.D.   On: 04/04/2016 10:13   Ct Angio Neck W Or Wo Contrast  Result Date: 04/04/2016 CLINICAL DATA:  Acute left MCA infarcts. New onset of right arm heaviness and numbness. Abnormal speech. Symptoms are improving. EXAM: CT ANGIOGRAPHY HEAD AND NECK TECHNIQUE: Multidetector CT imaging of the head and neck was performed using the standard protocol during bolus administration of intravenous contrast. Multiplanar CT image reconstructions and MIPs were obtained to evaluate the vascular anatomy. Carotid stenosis measurements  (when applicable) are obtained utilizing NASCET criteria, using the distal internal carotid diameter as the denominator. CONTRAST:  60 mL Isovue 370 COMPARISON:  None. FINDINGS: CTA NECK Aortic arch: There is a common origin of the left common carotid artery and innominate artery. No significant atherosclerotic calcification or stenosis is present at the aortic arch. Right carotid system: The right common carotid artery is within normal limits. The right carotid bifurcation is normal. The cervical right ICA is within normal limits. Left carotid system: The left common carotid artery is within normal limits. There is a focal filling defect at the origin of the left internal carotid artery concerning for a dissection flap. The more distal lumen is patent and without abnormality. The more distal cervical left ICA is normal. Vertebral arteries:The vertebral arteries are codominant. They both originate from the subclavian arteries. There are no significant stenoses or vascular abnormalities within the cervical vertebral arteries bilaterally. Skeleton: Endplate degenerative changes are present at multiple levels in the cervical spine. Asymmetric right-sided uncovertebral disease in osseous foraminal narrowing is present at C3-4 and C6-7. Disease is more prominent on the left at C5-6. No focal lytic or blastic lesions are present. Other neck: The soft tissues the neck are otherwise unremarkable. No significant mucosal or submucosal lesions are present. The salivary glands are within normal limits. The thyroid is normal. Prominent posterior right level 3 lymph nodes and sub cm bilateral level 2 lymph nodes appear to be reactive. Upper chest: The lung apices are clear. CTA HEAD Anterior circulation: The internal carotid arteries are within normal limits from the skullbase through the ICA termini bilaterally. The A1 and M1 segments are normal. The anterior communicating artery is patent. The MCA bifurcations are intact. No  significant branch vessel occlusion is evident. There is slight asymmetry to collateral vessels over the left convexity within the area of ischemia. Posterior circulation: The vertebral arteries are codominant. The PICA origins are visualized and normal. The basilar artery is normal. The right posterior cerebral artery is of fetal type. A small right P1 segment is present. The left posterior cerebral artery originates from the basilar tip. PCA branch vessels are within normal limits. Venous sinuses: The dural sinuses are patent. The right transverse sinus is dominant. The straight sinus and deep cerebral veins are intact. Cortical veins are unremarkable. Anatomic variants: Fetal type right posterior cerebral artery. Delayed phase: Not performed. IMPRESSION: 1. Focal filling defect at the origin of the left internal carotid artery is concerning for a dissection flap. 2. The more distal cervical left ICA is normal. 3. No significant proximal or mid MCA branch vessel occlusion is evident. 4. The right anterior and bilateral posterior circulation is normal. These results were called by telephone at the time of interpretation on 04/04/2016 at 10:01 am to Dr. Norman Clay , who verbally acknowledged  these results. Electronically Signed   By: San Morelle M.D.   On: 04/04/2016 10:13   Mr Jodene Nam Head Wo Contrast  Result Date: 04/04/2016 CLINICAL DATA:  Awoke this morning with right-sided numbness, tingling and slurred speech. EXAM: MRI HEAD WITHOUT CONTRAST MRA HEAD WITHOUT CONTRAST TECHNIQUE: Multiplanar, multiecho pulse sequences of the brain and surrounding structures were obtained without intravenous contrast. Angiographic images of the head were obtained using MRA technique without contrast. COMPARISON:  Head CT same day FINDINGS: MRI HEAD FINDINGS Axial and coronal diffusion imaging only is performed. There are areas of acute infarction developing within the left MCA territory at the left posterior frontal  cortical region an the left parietal cortical and subcortical region. No evidence of hemorrhage, swelling or mass effect. No hydrocephalus. No extra-axial collection. No visible changes of chronic ischemia. MRA HEAD FINDINGS Both internal carotid arteries are widely patent into the brain. No siphon stenosis. Right anterior and middle cerebral arteries are normal. Left anterior cerebral artery is normal. I think there is an occluded frontoparietal branch in the left MCA territory. Both vertebral arteries are widely patent to the basilar. No basilar stenosis. Superior cerebellar and posterior cerebral arteries are patent, with the right PCA taking a fetal origin from the anterior circulation. IMPRESSION: Developing acute infarction in the left MCA territory. Diffusion abnormality is patchy in the left posterior frontal and left parietal region. No swelling or hemorrhage visible. Occluded left M2-M3 branch. Electronically Signed   By: Nelson Chimes M.D.   On: 04/04/2016 09:05   Ct Cerebral Perfusion W Contrast  Result Date: 04/04/2016 CLINICAL DATA:  New onset of heaviness and numbness in the right arm. Slurred speech. Symptoms are resolving. EXAM: CT CEREBRAL PERFUSION WITH CONTRAST TECHNIQUE: Tenuous imaging was performed through the brain during bolus injection of IV contrast. Para metric maps were subsequently calculated using [employer]. CONTRAST:  40 mL Isovue 370 COMPARISON:  MRI brain from the same day. FINDINGS: An area of decreased profusion is evident with increased mean transit time and time to peak in the posterior left frontal lobe over the convexity. The more anterior left MCA territory is not involved. There scattered areas of core infarct at the periphery of this area. The majority is ischemic penumbra. Volume data is not currently available. IMPRESSION: 1. Prominent area of penumbra involving approximately 1/3 of the left MCA territory above the level of the operculum and sparing the anterior  MCA territory with minimal peripheral core infarct territory. These results were called by telephone at the time of interpretation on 04/04/2016 at 10:01 am to Dr. Norman Clay , who verbally acknowledged these results. Electronically Signed   By: San Morelle M.D.   On: 04/04/2016 10:22   Mr Brain Ltd W/o Cm  Result Date: 04/04/2016 CLINICAL DATA:  Awoke this morning with right-sided numbness, tingling and slurred speech. EXAM: MRI HEAD WITHOUT CONTRAST MRA HEAD WITHOUT CONTRAST TECHNIQUE: Multiplanar, multiecho pulse sequences of the brain and surrounding structures were obtained without intravenous contrast. Angiographic images of the head were obtained using MRA technique without contrast. COMPARISON:  Head CT same day FINDINGS: MRI HEAD FINDINGS Axial and coronal diffusion imaging only is performed. There are areas of acute infarction developing within the left MCA territory at the left posterior frontal cortical region an the left parietal cortical and subcortical region. No evidence of hemorrhage, swelling or mass effect. No hydrocephalus. No extra-axial collection. No visible changes of chronic ischemia. MRA HEAD FINDINGS Both internal carotid arteries are widely patent  into the brain. No siphon stenosis. Right anterior and middle cerebral arteries are normal. Left anterior cerebral artery is normal. I think there is an occluded frontoparietal branch in the left MCA territory. Both vertebral arteries are widely patent to the basilar. No basilar stenosis. Superior cerebellar and posterior cerebral arteries are patent, with the right PCA taking a fetal origin from the anterior circulation. IMPRESSION: Developing acute infarction in the left MCA territory. Diffusion abnormality is patchy in the left posterior frontal and left parietal region. No swelling or hemorrhage visible. Occluded left M2-M3 branch. Electronically Signed   By: Nelson Chimes M.D.   On: 04/04/2016 09:05   Ct Head Code Stroke  W/o Cm  Result Date: 04/04/2016 CLINICAL DATA:  Code stroke. Right-sided tingling. Speech difficulty. Stroke. EXAM: CT HEAD WITHOUT CONTRAST TECHNIQUE: Contiguous axial images were obtained from the base of the skull through the vertex without intravenous contrast. COMPARISON:  None. FINDINGS: Ventricle size normal. Negative for acute infarct. Negative for hemorrhage or mass. No edema identified. Negative calvarium. ASPECTS [facility] Stroke Program Early CT Score) - Ganglionic level infarction (caudate, lentiform nuclei, internal capsule, insula, M1-M3 cortex): 7 - Supraganglionic infarction (M4-M6 cortex): 3 Total score (0-10 with 10 being normal): 10 IMPRESSION: 1. Negative CT head 2. ASPECTS is 10 These results were called by telephone at the time of interpretation on 04/04/2016 at 8:18 am to Dr. Cammy Copa, who verbally acknowledged these results. Electronically Signed   By: Franchot Gallo M.D.   On: 04/04/2016 08:18      EKG: Independently reviewed. Date: 04/04/2016  Rate: 74  Rhythm: normal sinus rhythm  QRS Axis: normal  Intervals: normal  ST/T Wave abnormalities: normal  Conduction Disutrbances:none  Narrative Interpretation:   Old EKG Reviewed: none available   Assessment/Plan Principal Problem:   Acute cerebrovascular accident (CVA) ([place]) MRI consistent with developing acute left MCA territory infarct CT angio neck shows defect at the origin of the left internal carotid artery After 325 mg a day Lipid panel, hemoglobin A1c 2-D echo PT OT/speech consult Hypercoagulable panel, venous Doppler to rule out DVT      Rheumatoid arthritis (HCC)-patient is on Humira, check ESR    DVT prophylaxis:  lovenox      Code Status Orders full code       Family Communication: discussed with patient's son  By the bedside   Disposition Plan:  Anticipate discharge in 2-3 days pending further workup and clinical progress  Consults called: neurology  Admission status:  Inpatient-anticipated  the patient would need further interventions, will definitely stay here to mid nights to monitor outcome and evolution of his inital stroke   Total time spent 55 minutes.Greater than 50% of this time was spent in counseling, explanation of diagnosis, planning of further management, and coordination of care  [facility] MD [facility] Pager [phone]  If 7PM-7AM, please contact night-coverage [email or web address] Password [email or web address]  04/04/2016, 10:30 AM

## 2016-04-04 NOTE — ED Notes (Signed)
Pt leaving floor to come to 5 M

## 2016-04-04 NOTE — ED Notes (Signed)
After MRI, Neuro MD at the bedside decided to take patient back to CT to do Perfusion and CT Angio. Pt continues to be stable. No new symptoms.

## 2016-04-04 NOTE — ED Notes (Signed)
MD Neurology made decision to take patient to MRI. Pt was transported to MRI by this RN. Pt continued to be Stable with new or worsening symptoms. Pt was undressed. Belongings (Phone, Change, Pants, [place], Shirt) at the bedside. Pt was placed in the scanner.

## 2016-04-04 NOTE — Progress Notes (Signed)
Notified pharmacy for stroke booklet at this time.   Sim Boast, RN

## 2016-04-04 NOTE — ED Triage Notes (Addendum)
Per [facility] at [phone] when patient arrived, Pt is coming from [employer]. Pt reports waking up this morning and noticing that the right arm "didn't feel like his arm was his own." He reported numbness and tingling to the right arm. Pt went to the [employer]. While there, EMS was called and patient reports the arm symptoms starting to resolve. While speaking with EMS at 0751, Pt started to become aphasic. Aphasia comes and goes during conversation with EMS. Pt was alert and oriented x4 upon arrival to the ED. Pt was tearful. Pt reported continuous right sided tingling and aphasia. Vitals per EMS: 160/90, CBG 116

## 2016-04-04 NOTE — ED Notes (Signed)
Pt brought back to room and placed on the monitor. Wife notified to be allowed to come back and see the patient. Stroke RN, This RN, EMT, EDP, and Devoshire MD at the bedside.

## 2016-04-04 NOTE — ED Provider Notes (Addendum)
[facility]-EMERGENCY DEPT Provider Note   CSN: [id number] Arrival date & time: 04/04/16  0803     History   Chief Complaint No chief complaint on file.   HPI Hunter Wilson is a 40 y.o. male.  The patient brought in by EMS as a code stroke. Patient awoke this morning with a his right arm feeling heavy as if it wasn't there. But seemed to move okay felt like it was numb. Patient was on his way here to get evaluated when he started to develop speech problems. His wife was driving him. They stopped at the fire station. In patient developed the inability to speak at about the 751. Patient went to bed last night feeling normal. The right arm symptoms were present when he first woke up. Patient's speech is now improving and is able to speak some but having trouble getting some of the words out. Patient denies any headache chest pain shortness of breath any prior similar symptoms. Patient without any known risk factors. Patient's mother did have diabetes.  Patient evaluated at the bridge patient taken directly to CT scan. Stroke team present.      Past Medical History:  Diagnosis Date  . Arthritis     Patient Active Problem List   Diagnosis Date Noted  . Rheumatoid arthritis (HCC) 07/18/2015  . 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     Home Medications    Prior to Admission medications   Not on File    Family History No family history on file.  Social History Social History  Substance Use Topics  . Smoking status: Never Smoker  . Smokeless tobacco: Not on file  . Alcohol use No     Allergies   Review of patient's allergies indicates not on file.   Review of Systems Review of Systems  Unable to perform ROS: Acuity of condition  Constitutional: Negative for fever.  Respiratory: Negative for shortness of breath.   Cardiovascular: Negative for chest pain.  Gastrointestinal: Negative for abdominal pain.  Neurological: Positive  for speech difficulty and numbness. Negative for headaches.  Psychiatric/Behavioral: Negative for confusion.     Physical Exam Updated Vital Signs Wt 112.1 kg   Physical Exam  Constitutional: He is oriented to person, place, and time. He appears well-developed and well-nourished. No distress.  HENT:  Head: Normocephalic and atraumatic.  Eyes: EOM are normal. Pupils are equal, round, and reactive to light.  Neck: Normal range of motion. Neck supple.  Cardiovascular: Normal rate, regular rhythm and normal heart sounds.   Pulmonary/Chest: Effort normal and breath sounds normal.  Abdominal: Soft. Bowel sounds are normal. There is no tenderness.  Musculoskeletal: Normal range of motion. He exhibits no edema.  Neurological: He is alert and oriented to person, place, and time. A cranial nerve deficit is present. He exhibits normal muscle tone. Coordination normal.  The patient home with some speech difficulties. Trouble getting words out. Patient states that right arm feels numb and as if it's not there compared to left. The patient has good motor strength in that arm.  Skin: Skin is warm.  Nursing note and vitals reviewed.    ED Treatments / Results  Labs (all labs ordered are listed, but only abnormal results are displayed) Labs Reviewed  COMPREHENSIVE METABOLIC PANEL - Abnormal; Notable for the following:       Result Value   Glucose, Bld 114 (*)    AST 42 (*)    ALT 91 (*)  All other components within normal limits  I-STAT CHEM 8, ED - Abnormal; Notable for the following:    Glucose, Bld 108 (*)    Calcium, Ion 1.12 (*)    All other components within normal limits  ETHANOL  PROTIME-INR  APTT  CBC  DIFFERENTIAL  URINE RAPID DRUG SCREEN, HOSP PERFORMED  URINALYSIS, ROUTINE W REFLEX MICROSCOPIC (NOT AT [facility])  I-STAT TROPOININ, ED   Results for orders placed or performed during the hospital encounter of 04/04/16  Ethanol  Result Value Ref Range   Alcohol, Ethyl (B) <5  <5 mg/dL  Protime-INR  Result Value Ref Range   Prothrombin Time 13.2 11.4 - 15.2 seconds   INR 1.00   APTT  Result Value Ref Range   aPTT 29 24 - 36 seconds  CBC  Result Value Ref Range   WBC 7.4 4.0 - 10.5 K/uL   RBC 5.07 4.22 - 5.81 MIL/uL   Hemoglobin 16.1 13.0 - 17.0 g/dL   HCT 11.9 14.7 - 82.9 %   MCV 92.3 78.0 - 100.0 fL   MCH 31.8 26.0 - 34.0 pg   MCHC 34.4 30.0 - 36.0 g/dL   RDW 56.2 13.0 - 86.5 %   Platelets 258 150 - 400 K/uL  Differential  Result Value Ref Range   Neutrophils Relative % 49 %   Neutro Abs 3.6 1.7 - 7.7 K/uL   Lymphocytes Relative 38 %   Lymphs Abs 2.8 0.7 - 4.0 K/uL   Monocytes Relative 9 %   Monocytes Absolute 0.7 0.1 - 1.0 K/uL   Eosinophils Relative 3 %   Eosinophils Absolute 0.2 0.0 - 0.7 K/uL   Basophils Relative 1 %   Basophils Absolute 0.1 0.0 - 0.1 K/uL  Comprehensive metabolic panel  Result Value Ref Range   Sodium 141 135 - 145 mmol/L   Potassium 4.4 3.5 - 5.1 mmol/L   Chloride 109 101 - 111 mmol/L   CO2 22 22 - 32 mmol/L   Glucose, Bld 114 (H) 65 - 99 mg/dL   BUN 15 6 - 20 mg/dL   Creatinine, Ser 7.84 0.61 - 1.24 mg/dL   Calcium 9.2 8.9 - 69.6 mg/dL   Total Protein 7.0 6.5 - 8.1 g/dL   Albumin 3.9 3.5 - 5.0 g/dL   AST 42 (H) 15 - 41 U/L   ALT 91 (H) 17 - 63 U/L   Alkaline Phosphatase 57 38 - 126 U/L   Total Bilirubin 0.4 0.3 - 1.2 mg/dL   GFR calc non Af Amer >60 >60 mL/min   GFR calc Af Amer >60 >60 mL/min   Anion gap 10 5 - 15  I-Stat Chem 8, ED  (not at [facility], [facility])  Result Value Ref Range   Sodium 143 135 - 145 mmol/L   Potassium 4.3 3.5 - 5.1 mmol/L   Chloride 108 101 - 111 mmol/L   BUN 18 6 - 20 mg/dL   Creatinine, Ser 2.95 0.61 - 1.24 mg/dL   Glucose, Bld 284 (H) 65 - 99 mg/dL   Calcium, Ion 1.32 (L) 1.15 - 1.40 mmol/L   TCO2 23 0 - 100 mmol/L   Hemoglobin 16.0 13.0 - 17.0 g/dL   HCT 44.0 10.2 - 72.5 %  I-stat troponin, ED (not at [facility], [facility])  Result Value Ref Range   Troponin i, poc 0.00 0.00 - 0.08 ng/mL    Comment 3             EKG  EKG Interpretation None  ED ECG REPORT   Date: 04/04/2016  Rate: 74  Rhythm: normal sinus rhythm  QRS Axis: normal  Intervals: normal  ST/T Wave abnormalities: normal  Conduction Disutrbances:none  Narrative Interpretation:   Old EKG Reviewed: none available  I have personally reviewed the EKG tracing and agree with the computerized printout as noted.   Radiology Mr Maxine Glenn Head Wo Contrast  Result Date: 04/04/2016 CLINICAL DATA:  Awoke this morning with right-sided numbness, tingling and slurred speech. EXAM: MRI HEAD WITHOUT CONTRAST MRA HEAD WITHOUT CONTRAST TECHNIQUE: Multiplanar, multiecho pulse sequences of the brain and surrounding structures were obtained without intravenous contrast. Angiographic images of the head were obtained using MRA technique without contrast. COMPARISON:  Head CT same day FINDINGS: MRI HEAD FINDINGS Axial and coronal diffusion imaging only is performed. There are areas of acute infarction developing within the left MCA territory at the left posterior frontal cortical region an the left parietal cortical and subcortical region. No evidence of hemorrhage, swelling or mass effect. No hydrocephalus. No extra-axial collection. No visible changes of chronic ischemia. MRA HEAD FINDINGS Both internal carotid arteries are widely patent into the brain. No siphon stenosis. Right anterior and middle cerebral arteries are normal. Left anterior cerebral artery is normal. I think there is an occluded frontoparietal branch in the left MCA territory. Both vertebral arteries are widely patent to the basilar. No basilar stenosis. Superior cerebellar and posterior cerebral arteries are patent, with the right PCA taking a fetal origin from the anterior circulation. IMPRESSION: Developing acute infarction in the left MCA territory. Diffusion abnormality is patchy in the left posterior frontal and left parietal region. No swelling or hemorrhage visible.  Occluded left M2-M3 branch. Electronically Signed   By: Paulina Fusi M.D.   On: 04/04/2016 09:05   Mr Brain Ltd W/o Cm  Result Date: 04/04/2016 CLINICAL DATA:  Awoke this morning with right-sided numbness, tingling and slurred speech. EXAM: MRI HEAD WITHOUT CONTRAST MRA HEAD WITHOUT CONTRAST TECHNIQUE: Multiplanar, multiecho pulse sequences of the brain and surrounding structures were obtained without intravenous contrast. Angiographic images of the head were obtained using MRA technique without contrast. COMPARISON:  Head CT same day FINDINGS: MRI HEAD FINDINGS Axial and coronal diffusion imaging only is performed. There are areas of acute infarction developing within the left MCA territory at the left posterior frontal cortical region an the left parietal cortical and subcortical region. No evidence of hemorrhage, swelling or mass effect. No hydrocephalus. No extra-axial collection. No visible changes of chronic ischemia. MRA HEAD FINDINGS Both internal carotid arteries are widely patent into the brain. No siphon stenosis. Right anterior and middle cerebral arteries are normal. Left anterior cerebral artery is normal. I think there is an occluded frontoparietal branch in the left MCA territory. Both vertebral arteries are widely patent to the basilar. No basilar stenosis. Superior cerebellar and posterior cerebral arteries are patent, with the right PCA taking a fetal origin from the anterior circulation. IMPRESSION: Developing acute infarction in the left MCA territory. Diffusion abnormality is patchy in the left posterior frontal and left parietal region. No swelling or hemorrhage visible. Occluded left M2-M3 branch. Electronically Signed   By: Paulina Fusi M.D.   On: 04/04/2016 09:05   Ct Head Code Stroke W/o Cm  Result Date: 04/04/2016 CLINICAL DATA:  Code stroke. Right-sided tingling. Speech difficulty. Stroke. EXAM: CT HEAD WITHOUT CONTRAST TECHNIQUE: Contiguous axial images were obtained from the base  of the skull through the vertex without intravenous contrast. COMPARISON:  None. FINDINGS: Ventricle size  normal. Negative for acute infarct. Negative for hemorrhage or mass. No edema identified. Negative calvarium. ASPECTS [facility] Stroke Program Early CT Score) - Ganglionic level infarction (caudate, lentiform nuclei, internal capsule, insula, M1-M3 cortex): 7 - Supraganglionic infarction (M4-M6 cortex): 3 Total score (0-10 with 10 being normal): 10 IMPRESSION: 1. Negative CT head 2. ASPECTS is 10 These results were called by telephone at the time of interpretation on 04/04/2016 at 8:18 am to Dr. Dorothy Spark, who verbally acknowledged these results. Electronically Signed   By: Marlan Palau M.D.   On: 04/04/2016 08:18    Procedures Procedures (including critical care time)  CRITICAL CARE Performed by: Vanetta Mulders Total critical care time: 30 minutes Critical care time was exclusive of separately billable procedures and treating other patients. Critical care was necessary to treat or prevent imminent or life-threatening deterioration. Critical care was time spent personally by me on the following activities: development of treatment plan with patient and/or surrogate as well as nursing, discussions with consultants, evaluation of patient's response to treatment, examination of patient, obtaining history from patient or surrogate, ordering and performing treatments and interventions, ordering and review of laboratory studies, ordering and review of radiographic studies, pulse oximetry and re-evaluation of patient's condition.  Medications Ordered in ED Medications  iopamidol (ISOVUE-370) 76 % injection (not administered)     Initial Impression / Assessment and Plan / ED Course  I have reviewed the triage vital signs and the nursing notes.  Pertinent labs & imaging results that were available during my care of the patient were reviewed by me and considered in my medical decision making (see chart  for details).  Clinical Course   Patient evaluated by me as well as the stroke team. Acute onset of symptoms upon awaking had difficulty with the right arm feeling as if it wasn't fair and somewhat numb but it would move some. Then later patient developed a fascia. That was at about 750 in the morning. Brought in as a code stroke.  Patient Annabelle Harman me CT scan. An MRI MRI raise concerns for developing left MCA territory infarct. No evidence of any bleed. Patient went on to have CT a MRA brain perfusion scan. Patient's labs without any significant abnormalities. EKG without arrhythmias.  Neuro hospitalist is in consultation with radiology to determine treatment plan.  Patient given option for interventional radiology to evaluate the area of stroke. Patient decided not to take the option since he was improving. Patient will be admitted according to the stroke team patient will be admitted by internal medicine.  Final Clinical Impressions(s) / ED Diagnoses   Final diagnoses:  Cerebral infarction due to unspecified mechanism    New Prescriptions New Prescriptions   No medications on file     Vanetta Mulders, MD 04/04/16 [id number]    Vanetta Mulders, MD 04/04/16 [phone]

## 2016-04-04 NOTE — ED Notes (Signed)
Patient has returned from CT; patient placed on monitor, continuous pulse oximetry and blood pressure cuff

## 2016-04-04 NOTE — Care Management Note (Signed)
Case Management Note  Patient Details  Name: Hunter Wilson MRN: [id number] Date of Birth: [birth year]  Subjective/Objective:  Pt admitted with CVA. He is from home with his spouse.                  Action/Plan: No f/u per PT. CM following for d/c needs.   Expected Discharge Date:                  Expected Discharge Plan:  Home/Self Care  In-House Referral:     Discharge planning Services     Post Acute Care Choice:    Choice offered to:     DME Arranged:    DME Agency:     HH Arranged:    HH Agency:     Status of Service:  In process, will continue to follow  If discussed at Long Length of Stay Meetings, dates discussed:    Additional Comments:  Kermit Balo, RN 04/04/2016, 2:14 PM

## 2016-04-04 NOTE — Code Documentation (Signed)
40 year old male presents to [facility] via [facility] as code stroke.  Patient states he woke this am - shortly thereafter recognized right arm felt tingling and did not "feel like it was my arm."  He cannot be sure that he did not wake with these symptoms - he realized it before he was up and moving about.  He got up - went to the fire department where they report he was having difficulty with word finding - patient not sure his arm symptoms cleared.  Therefore LSW was last night at bedtime - around midnight.   EMS was called - they report some mild aphasia beginning around 0750.  On arrival to ED he is awake and alert - answers questions although fluency of speech is somewhat decreased with mild word finding difficulty - he is tearful at times.  His arm feels better - but reports occassionally it feels "tingling".  CT done - negative for bleed.  Taken stat to MRI - Dr. Zigmund Daniel at bedside.  MRI shows some diffusion restriction - exam continues to reveal some loss of fluency of speech - NIHHS 1. Patient reports only RA as medical history - on Humira.   Prepped for CTA and CTP.  Handoff to Shanda Bumps RN - neuro RN and Dahlia Client RN ED nurse.

## 2016-04-04 NOTE — Progress Notes (Signed)
Patient arrived to 5M07 from the ED at this time. POC/orders and safety precautions reviewed. Family at bedside. TELE applied and confirmed. No other distress noted. Will continue to monitor.   Sim Boast, RN

## 2016-04-04 NOTE — Code Documentation (Signed)
Stroke RN responded to code stroke activation.  Patient in MRI.  Stroke team at the bedside.  MRI confirms developing acute infarction in the left MCA territory.  MRA showing occluded left M2-M3 branch.  Patient reporting that he woke up with right arm numbness, therefore, LKW 2130 last night.  Patient with loss of fluency of speech on exam.  Patient transported to CT for CTA and CTP.  CTP showing a mismatch in the left MCA territory.  Dr. Corliss Skains made aware and reviewed imaging.  Treatment options discussed at the bedside with patient and wife by Dr. Corliss Skains.  Patient and wife declined catheter angiogram.  Patient to be admitted for stroke work-up.  Bedside handoff with ED RN Dahlia Client.

## 2016-04-04 NOTE — ED Notes (Signed)
Code Stroke paged out by [facility] @ [phone].

## 2016-04-04 NOTE — ED Notes (Addendum)
Patient going to floor with NIH of 1. Pt has no deficit noted, but expressive aphasia. Pt coming from home with complaints of right arm tingling and expressive aphasia that started this morning. Pt has completed CT, MRI, CTA, and CT Perfusion. Pt is able to stand and ambulate on his own. Utilized the urinal. Pt's family at the bedside. Pt has two sons and a wife who is pregnant with their third son. Pt continues to have expressive aphasia. Family decided against IR after discussion with Neurology due to risk. If patient becomes worse in anyway, Neurology is to be notified immediately and family agreed to have more aggressive treatment. Pt had to have 22 gauge IV Power Injectable IV placed in left upper arm due to hard stick for CT Perfusion. Pt is calm and cooperative. Passed Stroke Swallow Screen. HX of Rheumatoid Arthritis with no other medical or surgical hx.

## 2016-04-04 NOTE — ED Notes (Signed)
Pt arrived to CT with Neurology, this RN, Pharmacy and Stroke RN. Pt was able to move himself to the stretcher. Weight was obtained. Pt is alert and oriented x4. Pt was able to tolerate exam.

## 2016-04-05 ENCOUNTER — Inpatient Hospital Stay (HOSPITAL_COMMUNITY): Payer: 59

## 2016-04-05 ENCOUNTER — Encounter (HOSPITAL_COMMUNITY): Payer: Self-pay | Admitting: Internal Medicine

## 2016-04-05 DIAGNOSIS — R4701 Aphasia: Secondary | ICD-10-CM | POA: Diagnosis present

## 2016-04-05 DIAGNOSIS — I639 Cerebral infarction, unspecified: Secondary | ICD-10-CM

## 2016-04-05 DIAGNOSIS — E785 Hyperlipidemia, unspecified: Secondary | ICD-10-CM

## 2016-04-05 DIAGNOSIS — Q249 Congenital malformation of heart, unspecified: Secondary | ICD-10-CM

## 2016-04-05 DIAGNOSIS — I7771 Dissection of carotid artery: Secondary | ICD-10-CM

## 2016-04-05 DIAGNOSIS — I6789 Other cerebrovascular disease: Secondary | ICD-10-CM

## 2016-04-05 HISTORY — DX: Hyperlipidemia, unspecified: E78.5

## 2016-04-05 HISTORY — DX: Congenital malformation of heart, unspecified: Q24.9

## 2016-04-05 HISTORY — DX: Dissection of carotid artery: I77.71

## 2016-04-05 LAB — LIPID PANEL
Cholesterol: 171 mg/dL (ref 0–200)
HDL: 36 mg/dL — AB (ref >40)
LDL Cholesterol: 116 mg/dL — ABNORMAL HIGH (ref 0–99)
TRIGLYCERIDES: 94 mg/dL (ref <150)
Total CHOL/HDL Ratio: 4.8 RATIO
VLDL: 19 mg/dL (ref 0–40)

## 2016-04-05 LAB — ECHOCARDIOGRAM COMPLETE
HEIGHTINCHES: 70 in
WEIGHTICAEL: 3954.17 [oz_av]

## 2016-04-05 LAB — HOMOCYSTEINE: HOMOCYSTEINE-NORM: 15.8 umol/L — AB (ref 0.0–15.0)

## 2016-04-05 MED ORDER — ASPIRIN 325 MG PO TBEC: 325.0000 mg | DELAYED_RELEASE_TABLET | Freq: Every day | ORAL | 0 refills | Status: AC

## 2016-04-05 MED ORDER — PERFLUTREN LIPID MICROSPHERE
1.0000 mL | INTRAVENOUS | Status: AC | PRN
  Administered 2016-04-05: 2 mL via INTRAVENOUS
  Filled 2016-04-05: qty 10

## 2016-04-05 MED ORDER — ATORVASTATIN CALCIUM 10 MG PO TABS
20.0000 mg | ORAL_TABLET | Freq: Every day | ORAL | Status: DC
  Administered 2016-04-05: 20 mg via ORAL
  Filled 2016-04-05: qty 2

## 2016-04-05 MED ORDER — CLOPIDOGREL BISULFATE 75 MG PO TABS: 75.0000 mg | ORAL_TABLET | Freq: Every day | ORAL | 2 refills | Status: DC

## 2016-04-05 MED ORDER — CLOPIDOGREL BISULFATE 75 MG PO TABS
75.0000 mg | ORAL_TABLET | Freq: Every day | ORAL | Status: DC
  Administered 2016-04-05: 75 mg via ORAL
  Filled 2016-04-05: qty 1

## 2016-04-05 MED ORDER — ATORVASTATIN CALCIUM 20 MG PO TABS: 20.0000 mg | ORAL_TABLET | Freq: Every day | ORAL | 2 refills | Status: DC

## 2016-04-05 MED ORDER — PERFLUTREN LIPID MICROSPHERE
INTRAVENOUS | Status: AC
  Filled 2016-04-05: qty 10

## 2016-04-05 NOTE — Progress Notes (Signed)
STROKE TEAM PROGRESS NOTE   HISTORY OF PRESENT ILLNESS (per record) Hunter Wilson is an  40 y.o. male with hx of RA on humara who presents with waking up with R arm tingling and numbness followed by word find difficulties.  Date last known well: 04/03/16 Time last known well: last night before bed tPA Given: No: wake up stroke   SUBJECTIVE (INTERVAL HISTORY) The patient's father was at the bedside. Dr. Pearlean Brownie explained that he felt this stroke was embolic in etiology. The patient reports that he feels 90% better. 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 Dr. Pearlean Brownie will perform a TCD with bubble study for further evaluation. The patient may also need a TEE.   OBJECTIVE Temp:  [97.6 F (36.4 C)-98.1 F (36.7 C)] 97.6 F (36.4 C) (09/08 0929) Pulse Rate:  [63-77] 65 (09/08 0929) Cardiac Rhythm: Normal sinus rhythm;Bundle branch block (09/08 0700) Resp:  [17-18] 18 (09/08 0929) BP: (115-131)/(63-79) 129/79 (09/08 0929) SpO2:  [95 %-100 %] 100 % (09/08 0929)  CBC:   Recent Labs Lab 04/04/16 0804 04/04/16 0813  WBC 7.4  --   NEUTROABS 3.6  --   HGB 16.1 16.0  HCT 46.8 47.0  MCV 92.3  --   PLT 258  --     Basic Metabolic Panel:   Recent Labs Lab 04/04/16 0804 04/04/16 0813  NA 141 143  K 4.4 4.3  CL 109 108  CO2 22  --   GLUCOSE 114* 108*  BUN 15 18  CREATININE 1.07 1.00  CALCIUM 9.2  --     Lipid Panel:     Component Value Date/Time   CHOL 171 04/05/2016 0641   TRIG 94 04/05/2016 0641   HDL 36 (L) 04/05/2016 0641   CHOLHDL 4.8 04/05/2016 0641   VLDL 19 04/05/2016 0641   LDLCALC 116 (H) 04/05/2016 0641   HgbA1c: No results found for: HGBA1C Urine Drug Screen:     Component Value Date/Time   LABOPIA NONE DETECTED 04/04/2016 1000   COCAINSCRNUR NONE DETECTED 04/04/2016 1000   LABBENZ NONE DETECTED 04/04/2016 1000   AMPHETMU NONE DETECTED 04/04/2016 1000   THCU NONE DETECTED 04/04/2016 1000   LABBARB NONE DETECTED 04/04/2016 1000       IMAGING  Ct Angio Head and Neck W Or Wo Contrast 04/04/2016 1. Focal filling defect at the origin of the left internal carotid artery is concerning for a dissection flap.  2. The more distal cervical left ICA is normal.  3. No significant proximal or mid MCA branch vessel occlusion is evident.  4. The right anterior and bilateral posterior circulation is normal.      Mr Shirlee Latch Wo Contrast 04/04/2016 Developing acute infarction in the left MCA territory. Diffusion abnormality is patchy in the left posterior frontal and left parietal region. No swelling or hemorrhage visible. Occluded left M2-M3 branch.     Ct Cerebral Perfusion W Contrast 04/04/2016   ADDENDUM: IschemiaView RAPID CT profusion processing software was subsequently utilized. Using T-max > 6 seconds threshold, the volume of the ischemic penumbra is 62 ml. No focal core infarct is identified using a cerebral blood flow threshold of less than 30 percent. Penumbra in- core mismatch volume is 62 mL.   04/04/2016 1. Prominent area of penumbra involving approximately 1/3 of the left MCA territory above the level of the operculum and sparing the anterior MCA territory with minimal peripheral core infarct territory.      Mr [employer] W/o  Cm 04/04/2016 Developing acute infarction in the left MCA territory.  Diffusion abnormality is patchy in the left posterior frontal and left parietal region.  No swelling or hemorrhage visible.  Occluded left M2-M3 branch.     Ct Head Code Stroke W/o Cm 04/04/2016 1. Negative CT head  2. ASPECTS is 10    PHYSICAL EXAM Obese young Caucasian male not in distress. . Afebrile. Head is nontraumatic. Neck is supple without bruit.    Cardiac exam no murmur or gallop. Lungs are clear to auscultation. Distal pulses are well felt.  Neurological Exam ;  Awake  Alert oriented x 3. Normal speech and language.. Able to name repeat and comprehend quite well. eye movements full without nystagmus.fundi  were not visualized. Vision acuity and fields appear normal. Hearing is normal. Palatal movements are normal. Face symmetric. Tongue midline. Normal strength, tone, reflexes and coordination. Diminished fine finger movements on the right. Orbits left over right upper extremity. Symmetric grip strength. Normal sensation. Gait deferred.      ASSESSMENT/PLAN [name] is a 40 y.o. 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 He did not receive IV t-PA due to unknown time of onset.  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  Resultant  neuro deficits  MRI - Developing acute infarction in the left MCA territory.   MRA - Occluded left M2-M3 branch.   Carotid Doppler - see CTA of neck.  CTA Head & Neck - Focal filling defect at the origin of the left ICA is concerning for a dissection flap.   2D Echo - pending  TCDs with bubble study - pending  Lower extremity venous Dopplers - pending  LDL - 116  HgbA1c pending  VTE prophylaxis - Lovenox Diet Heart Room service appropriate? Yes; Fluid consistency: Thin  No antithrombotic prior to admission, now on aspirin 325 mg daily  Patient counseled to be compliant with his antithrombotic medications  Ongoing aggressive stroke risk factor management  Therapy recommendations: No follow-up physical therapy recommended. OT evaluation pending.  Disposition: Pending  Hypertension  Stable  Permissive hypertension (OK if < 220/120) but gradually normalize in 5-7 days  Long-term BP goal normotensive  Hyperlipidemia  Home meds:  No lipid lowering medications prior to admission.  LDL 116, goal < 70  Now on Lipitor 20 mg daily. May need 40 mg daily to reach goal.  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  Hospital day # 1  Delton See PA-C [facility] Pager [phone] 04/05/2016, 1:50 PM I have personally examined this patient, reviewed notes, independently viewed imaging studies, participated in medical decision making and plan of care.ROS completed by me personally and pertinent positives fully documented  I have made any additions or clarifications directly to the above note. Agree with note above. 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 Recommend  dual antiplatelet therapy with aspirin and Plavix for 3 months and repeat CT angiogram that time. Check transcranial Doppler bubble study for PFO given patient's known history of "and "hole in the heart.`` He presented many trigger lower extremity venous Doppler for paradoxical embolism. Discussion at bedside the patient, his father and Dr. Darnelle Catalan and answered questions. Greater than 50% time during this 35 minute visit was spent on counseling and coordination of care about stroke, risk, evaluation, prevention and treatment  Delia Heady, MD Medical Director [facility] Pager: [phone] 04/05/2016 2:05 PM    To contact Stroke Continuity provider, please refer to [email or web address]. After hours, contact General Neurology

## 2016-04-05 NOTE — Discharge Summary (Signed)
Physician Discharge Summary  Hunter Wilson TLX:[id number] DOB: [birth year] DOA: 04/04/2016  PCP: Deneen Harts, FNP  Admit date: 04/04/2016 Discharge date: 04/05/2016  Admitted From: Home Discharge disposition: Home   Recommendations for Outpatient Follow-Up:   1. F/U recommended with Dr. Excell Seltzer and Dr. Pearlean Brownie. Will need closure of intracardiac shunt. 2. Recommend F/U with rheumatologist to discuss Humira safety. 3. Recommend repeat LFTs in 4 weeks given initiation of statin therapy. 4. PCP: Please F/U on hemoglobin A1c test, which is pending.   Discharge Diagnosis:   Principal Problem:    Acute cerebrovascular accident (CVA) (HCC) Active Problems:    Rheumatoid arthritis (HCC)    Carotid artery dissection (HCC)    Hyperlipidemia    Aphasia    Congenital heart disease with intracardiac shunting   Discharge Condition: Improved.  Diet recommendation: Low sodium, heart healthy.    History of Present Illness:   Hunter Wilson is an 40 y.o. male with a PMH of congenital atrial septal defect (was told it closed) and rheumatoid arthritis managed with Humira who was admitted 04/04/16 for evaluation of aphasia with word finding difficulty and right arm paresthesias. MRI confirmed left MCA territory acute infarcts. CT angiography of the head and neck showed findings concerning for a left internal carotid artery dissection.   Hospital Course by Problem:   Principal Problem:   Acute cerebrovascular accident (CVA) (HCC)/Carotid artery dissection with h/o congenital heart disease with intracardiac shunting Patient was not a candidate for TPA. Neurology consulted. MRI brain done: scattered L MCA territory acute infracts. Patient subsequently had a CT angiography head and neck, which showed a focal filling defect at the origin of the left internal carotid artery concerning for a dissection flap. The more distal cervical left ICA was normal. Dr. Corliss Skains was then consulted for angiogram,  which was deferred pending further evaluation.Marland Kitchen LDL 116, so started on a statin. Hemoglobin A1c pending. Echocardiogram negative for ASD but bubble study +.  Follow-up with neurology in 2 months and with Dr. Excell Seltzer for TEE and further recommendations regarding endovascular closure.  LE dopplers negative for DVT. The patient's symptoms improved more rapidly than expected, and therefore was felt to be stable for D/C home 04/05/16.  Active Problems:   Hyperlipidemia LDL goal less than 70. Current LDL 116. Started Lipitor 20 mg daily. Will need repeat LFTs in 4-6 weeks.    Rheumatoid arthritis (HCC) Managed with Humira. Recommend discussing further with rheumatologist regarding safety of this med in light of new medical issues.   Medical Consultants:    Neurology   Discharge Exam:   Vitals:   04/05/16 0929 04/05/16 1355  BP: 129/79 126/67  Pulse: 65 69  Resp: 18 20  Temp: 97.6 F (36.4 C) 98 F (36.7 C)   Vitals:   04/05/16 0039 04/05/16 0503 04/05/16 0929 04/05/16 1355  BP: 115/64 131/63 129/79 126/67  Pulse: 68 63 65 69  Resp: 18 18 18 20   Temp: 98.1 F (36.7 C) 98.1 F (36.7 C) 97.6 F (36.4 C) 98 F (36.7 C)  TempSrc: Oral Oral Oral Oral  SpO2: 98% 97% 100% 98%  Weight:      Height:       General exam: Appears calm and comfortable.  Respiratory system: Clear to auscultation. Respiratory effort normal. Cardiovascular system: S1 & S2 heard, RRR. No JVD,  rubs, gallops or clicks. No murmurs. Gastrointestinal system: Abdomen is nondistended, soft and nontender. No organomegaly or masses felt. Normal bowel sounds heard. Central nervous system: Alert and oriented.  Mild word finding difficulty, no dysarthria. Extremities: No clubbing,  or cyanosis. No edema. Skin: No rashes, lesions or ulcers. Psychiatry: Judgement and insight appear normal. Mood & affect appropriate.   The results of significant diagnostics from this hospitalization (including imaging, microbiology,  ancillary and laboratory) are listed below for reference.     Procedures and Diagnostic Studies:   Ct Angio Head W Or Wo Contrast  Result Date: 04/04/2016 CLINICAL DATA:  Acute left MCA infarcts. New onset of right arm heaviness and numbness. Abnormal speech. Symptoms are improving. EXAM: CT ANGIOGRAPHY HEAD AND NECK TECHNIQUE: Multidetector CT imaging of the head and neck was performed using the standard protocol during bolus administration of intravenous contrast. Multiplanar CT image reconstructions and MIPs were obtained to evaluate the vascular anatomy. Carotid stenosis measurements (when applicable) are obtained utilizing NASCET criteria, using the distal internal carotid diameter as the denominator. CONTRAST:  60 mL Isovue 370 COMPARISON:  None. FINDINGS: CTA NECK Aortic arch: There is a common origin of the left common carotid artery and innominate artery. No significant atherosclerotic calcification or stenosis is present at the aortic arch. Right carotid system: The right common carotid artery is within normal limits. The right carotid bifurcation is normal. The cervical right ICA is within normal limits. Left carotid system: The left common carotid artery is within normal limits. There is a focal filling defect at the origin of the left internal carotid artery concerning for a dissection flap. The more distal lumen is patent and without abnormality. The more distal cervical left ICA is normal. Vertebral arteries:The vertebral arteries are codominant. They both originate from the subclavian arteries. There are no significant stenoses or vascular abnormalities within the cervical vertebral arteries bilaterally. Skeleton: Endplate degenerative changes are present at multiple levels in the cervical spine. Asymmetric right-sided uncovertebral disease in osseous foraminal narrowing is present at C3-4 and C6-7. Disease is more prominent on the left at C5-6. No focal lytic or blastic lesions are present.  Other neck: The soft tissues the neck are otherwise unremarkable. No significant mucosal or submucosal lesions are present. The salivary glands are within normal limits. The thyroid is normal. Prominent posterior right level 3 lymph nodes and sub cm bilateral level 2 lymph nodes appear to be reactive. Upper chest: The lung apices are clear. CTA HEAD Anterior circulation: The internal carotid arteries are within normal limits from the skullbase through the ICA termini bilaterally. The A1 and M1 segments are normal. The anterior communicating artery is patent. The MCA bifurcations are intact. No significant branch vessel occlusion is evident. There is slight asymmetry to collateral vessels over the left convexity within the area of ischemia. Posterior circulation: The vertebral arteries are codominant. The PICA origins are visualized and normal. The basilar artery is normal. The right posterior cerebral artery is of fetal type. A small right P1 segment is present. The left posterior cerebral artery originates from the basilar tip. PCA branch vessels are within normal limits. Venous sinuses: The dural sinuses are patent. The right transverse sinus is dominant. The straight sinus and deep cerebral veins are intact. Cortical veins are unremarkable. Anatomic variants: Fetal type right posterior cerebral artery. Delayed phase: Not performed. IMPRESSION: 1. Focal filling defect at the origin of the left internal carotid artery is concerning for a dissection flap. 2. The more distal cervical left ICA is normal. 3. No significant proximal or mid MCA branch vessel occlusion is evident. 4. The right anterior and bilateral posterior circulation is normal. These results were called  by telephone at the time of interpretation on 04/04/2016 at 10:01 am to Dr. Lenise Herald , who verbally acknowledged these results. Electronically Signed   By: Marin Roberts M.D.   On: 04/04/2016 10:13   Ct Angio Neck W Or Wo  Contrast  Result Date: 04/04/2016 CLINICAL DATA:  Acute left MCA infarcts. New onset of right arm heaviness and numbness. Abnormal speech. Symptoms are improving. EXAM: CT ANGIOGRAPHY HEAD AND NECK TECHNIQUE: Multidetector CT imaging of the head and neck was performed using the standard protocol during bolus administration of intravenous contrast. Multiplanar CT image reconstructions and MIPs were obtained to evaluate the vascular anatomy. Carotid stenosis measurements (when applicable) are obtained utilizing NASCET criteria, using the distal internal carotid diameter as the denominator. CONTRAST:  60 mL Isovue 370 COMPARISON:  None. FINDINGS: CTA NECK Aortic arch: There is a common origin of the left common carotid artery and innominate artery. No significant atherosclerotic calcification or stenosis is present at the aortic arch. Right carotid system: The right common carotid artery is within normal limits. The right carotid bifurcation is normal. The cervical right ICA is within normal limits. Left carotid system: The left common carotid artery is within normal limits. There is a focal filling defect at the origin of the left internal carotid artery concerning for a dissection flap. The more distal lumen is patent and without abnormality. The more distal cervical left ICA is normal. Vertebral arteries:The vertebral arteries are codominant. They both originate from the subclavian arteries. There are no significant stenoses or vascular abnormalities within the cervical vertebral arteries bilaterally. Skeleton: Endplate degenerative changes are present at multiple levels in the cervical spine. Asymmetric right-sided uncovertebral disease in osseous foraminal narrowing is present at C3-4 and C6-7. Disease is more prominent on the left at C5-6. No focal lytic or blastic lesions are present. Other neck: The soft tissues the neck are otherwise unremarkable. No significant mucosal or submucosal lesions are present. The  salivary glands are within normal limits. The thyroid is normal. Prominent posterior right level 3 lymph nodes and sub cm bilateral level 2 lymph nodes appear to be reactive. Upper chest: The lung apices are clear. CTA HEAD Anterior circulation: The internal carotid arteries are within normal limits from the skullbase through the ICA termini bilaterally. The A1 and M1 segments are normal. The anterior communicating artery is patent. The MCA bifurcations are intact. No significant branch vessel occlusion is evident. There is slight asymmetry to collateral vessels over the left convexity within the area of ischemia. Posterior circulation: The vertebral arteries are codominant. The PICA origins are visualized and normal. The basilar artery is normal. The right posterior cerebral artery is of fetal type. A small right P1 segment is present. The left posterior cerebral artery originates from the basilar tip. PCA branch vessels are within normal limits. Venous sinuses: The dural sinuses are patent. The right transverse sinus is dominant. The straight sinus and deep cerebral veins are intact. Cortical veins are unremarkable. Anatomic variants: Fetal type right posterior cerebral artery. Delayed phase: Not performed. IMPRESSION: 1. Focal filling defect at the origin of the left internal carotid artery is concerning for a dissection flap. 2. The more distal cervical left ICA is normal. 3. No significant proximal or mid MCA branch vessel occlusion is evident. 4. The right anterior and bilateral posterior circulation is normal. These results were called by telephone at the time of interpretation on 04/04/2016 at 10:01 am to Dr. Lenise Herald , who verbally acknowledged these results. Electronically  Signed   By: Marin Roberts M.D.   On: 04/04/2016 10:13   Mr Maxine Glenn Head Wo Contrast  Result Date: 04/04/2016 CLINICAL DATA:  Awoke this morning with right-sided numbness, tingling and slurred speech. EXAM: MRI HEAD WITHOUT  CONTRAST MRA HEAD WITHOUT CONTRAST TECHNIQUE: Multiplanar, multiecho pulse sequences of the brain and surrounding structures were obtained without intravenous contrast. Angiographic images of the head were obtained using MRA technique without contrast. COMPARISON:  Head CT same day FINDINGS: MRI HEAD FINDINGS Axial and coronal diffusion imaging only is performed. There are areas of acute infarction developing within the left MCA territory at the left posterior frontal cortical region an the left parietal cortical and subcortical region. No evidence of hemorrhage, swelling or mass effect. No hydrocephalus. No extra-axial collection. No visible changes of chronic ischemia. MRA HEAD FINDINGS Both internal carotid arteries are widely patent into the brain. No siphon stenosis. Right anterior and middle cerebral arteries are normal. Left anterior cerebral artery is normal. I think there is an occluded frontoparietal branch in the left MCA territory. Both vertebral arteries are widely patent to the basilar. No basilar stenosis. Superior cerebellar and posterior cerebral arteries are patent, with the right PCA taking a fetal origin from the anterior circulation. IMPRESSION: Developing acute infarction in the left MCA territory. Diffusion abnormality is patchy in the left posterior frontal and left parietal region. No swelling or hemorrhage visible. Occluded left M2-M3 branch. Electronically Signed   By: Paulina Fusi M.D.   On: 04/04/2016 09:05   Ct Cerebral Perfusion W Contrast  Addendum Date: 04/04/2016   ADDENDUM REPORT: 04/04/2016 12:43 ADDENDUM: IschemiaView RAPID CT profusion processing software was subsequently utilized. Using T-max > 6 seconds threshold, the volume of the ischemic penumbra is 62 ml. No focal core infarct is identified using a cerebral blood flow threshold of less than 30 percent. Penumbra in- core mismatch volume is 62 mL. Electronically Signed   By: Marin Roberts M.D.   On: 04/04/2016  12:43   Result Date: 04/04/2016 CLINICAL DATA:  New onset of heaviness and numbness in the right arm. Slurred speech. Symptoms are resolving. EXAM: CT CEREBRAL PERFUSION WITH CONTRAST TECHNIQUE: Tenuous imaging was performed through the brain during bolus injection of IV contrast. Para metric maps were subsequently calculated using [employer]. CONTRAST:  40 mL Isovue 370 COMPARISON:  MRI brain from the same day. FINDINGS: An area of decreased profusion is evident with increased mean transit time and time to peak in the posterior left frontal lobe over the convexity. The more anterior left MCA territory is not involved. There scattered areas of core infarct at the periphery of this area. The majority is ischemic penumbra. Volume data is not currently available. IMPRESSION: 1. Prominent area of penumbra involving approximately 1/3 of the left MCA territory above the level of the operculum and sparing the anterior MCA territory with minimal peripheral core infarct territory. These results were called by telephone at the time of interpretation on 04/04/2016 at 10:01 am to Dr. Lenise Herald , who verbally acknowledged these results. Electronically Signed: By: Marin Roberts M.D. On: 04/04/2016 10:22   Mr Brain Ltd W/o Cm  Result Date: 04/04/2016 CLINICAL DATA:  Awoke this morning with right-sided numbness, tingling and slurred speech. EXAM: MRI HEAD WITHOUT CONTRAST MRA HEAD WITHOUT CONTRAST TECHNIQUE: Multiplanar, multiecho pulse sequences of the brain and surrounding structures were obtained without intravenous contrast. Angiographic images of the head were obtained using MRA technique without contrast. COMPARISON:  Head CT same day  FINDINGS: MRI HEAD FINDINGS Axial and coronal diffusion imaging only is performed. There are areas of acute infarction developing within the left MCA territory at the left posterior frontal cortical region an the left parietal cortical and subcortical region. No evidence  of hemorrhage, swelling or mass effect. No hydrocephalus. No extra-axial collection. No visible changes of chronic ischemia. MRA HEAD FINDINGS Both internal carotid arteries are widely patent into the brain. No siphon stenosis. Right anterior and middle cerebral arteries are normal. Left anterior cerebral artery is normal. I think there is an occluded frontoparietal branch in the left MCA territory. Both vertebral arteries are widely patent to the basilar. No basilar stenosis. Superior cerebellar and posterior cerebral arteries are patent, with the right PCA taking a fetal origin from the anterior circulation. IMPRESSION: Developing acute infarction in the left MCA territory. Diffusion abnormality is patchy in the left posterior frontal and left parietal region. No swelling or hemorrhage visible. Occluded left M2-M3 branch. Electronically Signed   By: Paulina Fusi M.D.   On: 04/04/2016 09:05   Ct Head Code Stroke W/o Cm  Result Date: 04/04/2016 CLINICAL DATA:  Code stroke. Right-sided tingling. Speech difficulty. Stroke. EXAM: CT HEAD WITHOUT CONTRAST TECHNIQUE: Contiguous axial images were obtained from the base of the skull through the vertex without intravenous contrast. COMPARISON:  None. FINDINGS: Ventricle size normal. Negative for acute infarct. Negative for hemorrhage or mass. No edema identified. Negative calvarium. ASPECTS [facility] Stroke Program Early CT Score) - Ganglionic level infarction (caudate, lentiform nuclei, internal capsule, insula, M1-M3 cortex): 7 - Supraganglionic infarction (M4-M6 cortex): 3 Total score (0-10 with 10 being normal): 10 IMPRESSION: 1. Negative CT head 2. ASPECTS is 10 These results were called by telephone at the time of interpretation on 04/04/2016 at 8:18 am to Dr. Dorothy Spark, who verbally acknowledged these results. Electronically Signed   By: Marlan Palau M.D.   On: 04/04/2016 08:18     Labs:   Basic Metabolic Panel:  Recent Labs Lab 04/04/16 0804 04/04/16 0813   NA 141 143  K 4.4 4.3  CL 109 108  CO2 22  --   GLUCOSE 114* 108*  BUN 15 18  CREATININE 1.07 1.00  CALCIUM 9.2  --    GFR Estimated Creatinine Clearance: 121.8 mL/min (by C-G formula based on SCr of 1 mg/dL). Liver Function Tests:  Recent Labs Lab 04/04/16 0804  AST 42*  ALT 91*  ALKPHOS 57  BILITOT 0.4  PROT 7.0  ALBUMIN 3.9   Coagulation profile  Recent Labs Lab 04/04/16 0804 04/04/16 1105  INR 1.00 1.07    CBC:  Recent Labs Lab 04/04/16 0804 04/04/16 0813  WBC 7.4  --   NEUTROABS 3.6  --   HGB 16.1 16.0  HCT 46.8 47.0  MCV 92.3  --   PLT 258  --    Cardiac Enzymes:  Recent Labs Lab 04/04/16 1105 04/04/16 1618 04/04/16 2218  TROPONINI <0.03 <0.03 <0.03   Lipid Profile  Recent Labs  04/05/16 0641  CHOL 171  HDL 36*  LDLCALC 116*  TRIG 94  CHOLHDL 4.8     Discharge Instructions:   Discharge Instructions    Call MD for:    Complete by:  As directed   Any new neurological symptoms such as loss of power, slurred speech, double vision, loss of sensation.   Call MD for:  persistant dizziness or light-headedness    Complete by:  As directed   Diet - low sodium heart healthy  Complete by:  As directed   Increase activity slowly    Complete by:  As directed       Medication List    TAKE these medications   aspirin 325 MG EC tablet Take 1 tablet (325 mg total) by mouth daily.   atorvastatin 20 MG tablet Commonly known as:  LIPITOR Take 1 tablet (20 mg total) by mouth daily at 6 PM.   clopidogrel 75 MG tablet Commonly known as:  PLAVIX Take 1 tablet (75 mg total) by mouth daily.   HUMIRA 40 MG/0.8ML Pskt Generic drug:  Adalimumab Inject 40 mg into the skin See admin instructions. Every other Wednesday      Follow-up Information    Tonny Bollman, MD .   Specialty:  Cardiology Why:  Office will call with an appt time. Contact information: [address] [place] [address] [phone]         Delia Heady, MD. Schedule an appointment as soon as possible for a visit in 2 month(s).   Specialties:  Neurology, Radiology Why:  Follow up. Contact information: [address] [place] [address] [phone]        [facility], FNP. Schedule an appointment as soon as possible for a visit in 4 week(s).   Specialty:  Nurse Practitioner Why:  Liver function testing. Contact information: [address] [place] [address] [phone]            Time coordinating discharge: > 35 minutes.  Signed:  RAMA,CHRISTINA  Pager [phone] [facility] 04/05/2016, 4:18 PM

## 2016-04-05 NOTE — Progress Notes (Addendum)
Progress Note    Hunter Wilson  ZOX:[id number] DOB: September 25, 1975  DOA: 04/04/2016 PCP: No primary care provider on file.    Brief Narrative:   Chief complaint: Follow-up acute CVA  Hunter Wilson is an 40 y.o. male with a PMH of rheumatoid arthritis managed with Humira who was admitted 04/04/16 for evaluation of aphasia with word finding difficulty and right arm paresthesias. MRI confirmed left MCA territory acute infarcts. CT angiography of the head and neck showed findings concerning for a left internal carotid artery dissection. The patient refused further evaluation with catheter angiogram.  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 Neurology consulted. MRI brain done: scattered L MCA territory acute infracts. Patient subsequently had a CT angiography head and neck, which showed a focal filling defect at the origin of the left internal carotid artery concerning for a dissection flap. The more distal cervical left ICA was normal. Dr. Corliss Skains was then consulted for angiogram, which was deferred pending further evaluation.Marland Kitchen LDL 116, will add statin. Hemoglobin A1c pending. Echocardiogram pending. PT/OT/ST. Continue neuro checks. Follow-up neurology recommendations.  Active Problems:   Hyperlipidemia LDL goal less than 70. Current LDL 116. Start Lipitor 20 mg daily. Will need repeat LFTs in 4-6 weeks.    Rheumatoid arthritis (HCC) Managed with Humira.    Family Communication/Anticipated D/C date and plan/Code Status   DVT prophylaxis: Lovenox ordered. Code Status: Full Code.  Family Communication: Father at bedside, wife over the telephone. Disposition Plan: Home later today or tomorrow depending on test results.   Medical Consultants:    Neurology   Procedures:    None  Anti-Infectives:    None  Subjective:   The patient reports that He continues to have some  word finding difficulty but that overall, it is better. Right-sided paresthesias/loss of power have resolved. Review of symptoms is negative for visual changes, trouble swallowing.  Objective:    Vitals:   04/04/16 2100 04/04/16 2300 04/05/16 0039 04/05/16 0503  BP: 130/79 127/66 115/64 131/63  Pulse: 71 63 68 63  Resp: 18 18 18 18   Temp: 97.9 F (36.6 C) 97.8 F (36.6 C) 98.1 F (36.7 C) 98.1 F (36.7 C)  TempSrc: Oral Oral Oral Oral  SpO2: 95% 100% 98% 97%  Weight:      Height:        Intake/Output Summary (Last 24 hours) at 04/05/16 0817 Last data filed at 04/04/16 2100  Gross per 24 hour  Intake          1011.25 ml  Output                0 ml  Net          1011.25 ml   Filed Weights   04/04/16 0800 04/04/16 0930  Weight: 112.1 kg (247 lb 2.2 oz) 112.1 kg (247 lb 2.2 oz)    Exam: General exam: Appears calm and comfortable.  Respiratory system: Clear to auscultation. Respiratory effort normal. Cardiovascular system: S1 & S2 heard, RRR. No JVD,  rubs, gallops or clicks. No murmurs. Gastrointestinal system: Abdomen is nondistended, soft and nontender. No organomegaly or masses felt. Normal bowel sounds heard. Central nervous system: Alert and oriented. Mild word finding difficulty, no dysarthria. Extremities: No clubbing,  or cyanosis. No edema. Skin: No rashes, lesions or ulcers. Psychiatry: Judgement and insight appear normal. Mood & affect appropriate.   Data Reviewed:   I have  personally reviewed following labs and imaging studies:  Labs: Basic Metabolic Panel:  Recent Labs Lab 04/04/16 0804 04/04/16 0813  NA 141 143  K 4.4 4.3  CL 109 108  CO2 22  --   GLUCOSE 114* 108*  BUN 15 18  CREATININE 1.07 1.00  CALCIUM 9.2  --    GFR Estimated Creatinine Clearance: 121.8 mL/min (by C-G formula based on SCr of 1 mg/dL). Liver Function Tests:  Recent Labs Lab 04/04/16 0804  AST 42*  ALT 91*  ALKPHOS 57  BILITOT 0.4  PROT 7.0  ALBUMIN 3.9    Coagulation profile  Recent Labs Lab 04/04/16 0804 04/04/16 1105  INR 1.00 1.07    CBC:  Recent Labs Lab 04/04/16 0804 04/04/16 0813  WBC 7.4  --   NEUTROABS 3.6  --   HGB 16.1 16.0  HCT 46.8 47.0  MCV 92.3  --   PLT 258  --    Cardiac Enzymes:  Recent Labs Lab 04/04/16 1105 04/04/16 1618 04/04/16 2218  TROPONINI <0.03 <0.03 <0.03   Lipid Profile:  Recent Labs  04/05/16 0641  CHOL 171  HDL 36*  LDLCALC 116*  TRIG 94  CHOLHDL 4.8    Microbiology No results found for this or any previous visit (from the past 240 hour(s)).  Radiology: Ct Angio Head W Or Wo Contrast  Result Date: 04/04/2016 CLINICAL DATA:  Acute left MCA infarcts. New onset of right arm heaviness and numbness. Abnormal speech. Symptoms are improving. EXAM: CT ANGIOGRAPHY HEAD AND NECK TECHNIQUE: Multidetector CT imaging of the head and neck was performed using the standard protocol during bolus administration of intravenous contrast. Multiplanar CT image reconstructions and MIPs were obtained to evaluate the vascular anatomy. Carotid stenosis measurements (when applicable) are obtained utilizing NASCET criteria, using the distal internal carotid diameter as the denominator. CONTRAST:  60 mL Isovue 370 COMPARISON:  None. FINDINGS: CTA NECK Aortic arch: There is a common origin of the left common carotid artery and innominate artery. No significant atherosclerotic calcification or stenosis is present at the aortic arch. Right carotid system: The right common carotid artery is within normal limits. The right carotid bifurcation is normal. The cervical right ICA is within normal limits. Left carotid system: The left common carotid artery is within normal limits. There is a focal filling defect at the origin of the left internal carotid artery concerning for a dissection flap. The more distal lumen is patent and without abnormality. The more distal cervical left ICA is normal. Vertebral arteries:The  vertebral arteries are codominant. They both originate from the subclavian arteries. There are no significant stenoses or vascular abnormalities within the cervical vertebral arteries bilaterally. Skeleton: Endplate degenerative changes are present at multiple levels in the cervical spine. Asymmetric right-sided uncovertebral disease in osseous foraminal narrowing is present at C3-4 and C6-7. Disease is more prominent on the left at C5-6. No focal lytic or blastic lesions are present. Other neck: The soft tissues the neck are otherwise unremarkable. No significant mucosal or submucosal lesions are present. The salivary glands are within normal limits. The thyroid is normal. Prominent posterior right level 3 lymph nodes and sub cm bilateral level 2 lymph nodes appear to be reactive. Upper chest: The lung apices are clear. CTA HEAD Anterior circulation: The internal carotid arteries are within normal limits from the skullbase through the ICA termini bilaterally. The A1 and M1 segments are normal. The anterior communicating artery is patent. The MCA bifurcations are intact. No significant branch vessel occlusion  is evident. There is slight asymmetry to collateral vessels over the left convexity within the area of ischemia. Posterior circulation: The vertebral arteries are codominant. The PICA origins are visualized and normal. The basilar artery is normal. The right posterior cerebral artery is of fetal type. A small right P1 segment is present. The left posterior cerebral artery originates from the basilar tip. PCA branch vessels are within normal limits. Venous sinuses: The dural sinuses are patent. The right transverse sinus is dominant. The straight sinus and deep cerebral veins are intact. Cortical veins are unremarkable. Anatomic variants: Fetal type right posterior cerebral artery. Delayed phase: Not performed. IMPRESSION: 1. Focal filling defect at the origin of the left internal carotid artery is concerning  for a dissection flap. 2. The more distal cervical left ICA is normal. 3. No significant proximal or mid MCA branch vessel occlusion is evident. 4. The right anterior and bilateral posterior circulation is normal. These results were called by telephone at the time of interpretation on 04/04/2016 at 10:01 am to Dr. Lenise Herald , who verbally acknowledged these results. Electronically Signed   By: Marin Roberts M.D.   On: 04/04/2016 10:13   Ct Angio Neck W Or Wo Contrast  Result Date: 04/04/2016 CLINICAL DATA:  Acute left MCA infarcts. New onset of right arm heaviness and numbness. Abnormal speech. Symptoms are improving. EXAM: CT ANGIOGRAPHY HEAD AND NECK TECHNIQUE: Multidetector CT imaging of the head and neck was performed using the standard protocol during bolus administration of intravenous contrast. Multiplanar CT image reconstructions and MIPs were obtained to evaluate the vascular anatomy. Carotid stenosis measurements (when applicable) are obtained utilizing NASCET criteria, using the distal internal carotid diameter as the denominator. CONTRAST:  60 mL Isovue 370 COMPARISON:  None. FINDINGS: CTA NECK Aortic arch: There is a common origin of the left common carotid artery and innominate artery. No significant atherosclerotic calcification or stenosis is present at the aortic arch. Right carotid system: The right common carotid artery is within normal limits. The right carotid bifurcation is normal. The cervical right ICA is within normal limits. Left carotid system: The left common carotid artery is within normal limits. There is a focal filling defect at the origin of the left internal carotid artery concerning for a dissection flap. The more distal lumen is patent and without abnormality. The more distal cervical left ICA is normal. Vertebral arteries:The vertebral arteries are codominant. They both originate from the subclavian arteries. There are no significant stenoses or vascular  abnormalities within the cervical vertebral arteries bilaterally. Skeleton: Endplate degenerative changes are present at multiple levels in the cervical spine. Asymmetric right-sided uncovertebral disease in osseous foraminal narrowing is present at C3-4 and C6-7. Disease is more prominent on the left at C5-6. No focal lytic or blastic lesions are present. Other neck: The soft tissues the neck are otherwise unremarkable. No significant mucosal or submucosal lesions are present. The salivary glands are within normal limits. The thyroid is normal. Prominent posterior right level 3 lymph nodes and sub cm bilateral level 2 lymph nodes appear to be reactive. Upper chest: The lung apices are clear. CTA HEAD Anterior circulation: The internal carotid arteries are within normal limits from the skullbase through the ICA termini bilaterally. The A1 and M1 segments are normal. The anterior communicating artery is patent. The MCA bifurcations are intact. No significant branch vessel occlusion is evident. There is slight asymmetry to collateral vessels over the left convexity within the area of ischemia. Posterior circulation: The vertebral arteries  are codominant. The PICA origins are visualized and normal. The basilar artery is normal. The right posterior cerebral artery is of fetal type. A small right P1 segment is present. The left posterior cerebral artery originates from the basilar tip. PCA branch vessels are within normal limits. Venous sinuses: The dural sinuses are patent. The right transverse sinus is dominant. The straight sinus and deep cerebral veins are intact. Cortical veins are unremarkable. Anatomic variants: Fetal type right posterior cerebral artery. Delayed phase: Not performed. IMPRESSION: 1. Focal filling defect at the origin of the left internal carotid artery is concerning for a dissection flap. 2. The more distal cervical left ICA is normal. 3. No significant proximal or mid MCA branch vessel occlusion  is evident. 4. The right anterior and bilateral posterior circulation is normal. These results were called by telephone at the time of interpretation on 04/04/2016 at 10:01 am to Dr. Lenise Herald , who verbally acknowledged these results. Electronically Signed   By: Marin Roberts M.D.   On: 04/04/2016 10:13   Mr Maxine Glenn Head Wo Contrast  Result Date: 04/04/2016 CLINICAL DATA:  Awoke this morning with right-sided numbness, tingling and slurred speech. EXAM: MRI HEAD WITHOUT CONTRAST MRA HEAD WITHOUT CONTRAST TECHNIQUE: Multiplanar, multiecho pulse sequences of the brain and surrounding structures were obtained without intravenous contrast. Angiographic images of the head were obtained using MRA technique without contrast. COMPARISON:  Head CT same day FINDINGS: MRI HEAD FINDINGS Axial and coronal diffusion imaging only is performed. There are areas of acute infarction developing within the left MCA territory at the left posterior frontal cortical region an the left parietal cortical and subcortical region. No evidence of hemorrhage, swelling or mass effect. No hydrocephalus. No extra-axial collection. No visible changes of chronic ischemia. MRA HEAD FINDINGS Both internal carotid arteries are widely patent into the brain. No siphon stenosis. Right anterior and middle cerebral arteries are normal. Left anterior cerebral artery is normal. I think there is an occluded frontoparietal branch in the left MCA territory. Both vertebral arteries are widely patent to the basilar. No basilar stenosis. Superior cerebellar and posterior cerebral arteries are patent, with the right PCA taking a fetal origin from the anterior circulation. IMPRESSION: Developing acute infarction in the left MCA territory. Diffusion abnormality is patchy in the left posterior frontal and left parietal region. No swelling or hemorrhage visible. Occluded left M2-M3 branch. Electronically Signed   By: Paulina Fusi M.D.   On: 04/04/2016 09:05    Ct Cerebral Perfusion W Contrast  Addendum Date: 04/04/2016   ADDENDUM REPORT: 04/04/2016 12:43 ADDENDUM: IschemiaView RAPID CT profusion processing software was subsequently utilized. Using T-max > 6 seconds threshold, the volume of the ischemic penumbra is 62 ml. No focal core infarct is identified using a cerebral blood flow threshold of less than 30 percent. Penumbra in- core mismatch volume is 62 mL. Electronically Signed   By: Marin Roberts M.D.   On: 04/04/2016 12:43   Result Date: 04/04/2016 CLINICAL DATA:  New onset of heaviness and numbness in the right arm. Slurred speech. Symptoms are resolving. EXAM: CT CEREBRAL PERFUSION WITH CONTRAST TECHNIQUE: Tenuous imaging was performed through the brain during bolus injection of IV contrast. Para metric maps were subsequently calculated using [employer]. CONTRAST:  40 mL Isovue 370 COMPARISON:  MRI brain from the same day. FINDINGS: An area of decreased profusion is evident with increased mean transit time and time to peak in the posterior left frontal lobe over the convexity. The more anterior left  MCA territory is not involved. There scattered areas of core infarct at the periphery of this area. The majority is ischemic penumbra. Volume data is not currently available. IMPRESSION: 1. Prominent area of penumbra involving approximately 1/3 of the left MCA territory above the level of the operculum and sparing the anterior MCA territory with minimal peripheral core infarct territory. These results were called by telephone at the time of interpretation on 04/04/2016 at 10:01 am to Dr. Lenise HeraldALEKSANDR SHIKHMAN , who verbally acknowledged these results. Electronically Signed: By: Marin Robertshristopher  Mattern M.D. On: 04/04/2016 10:22   Mr Brain Ltd W/o Cm  Result Date: 04/04/2016 CLINICAL DATA:  Awoke this morning with right-sided numbness, tingling and slurred speech. EXAM: MRI HEAD WITHOUT CONTRAST MRA HEAD WITHOUT CONTRAST TECHNIQUE: Multiplanar, multiecho  pulse sequences of the brain and surrounding structures were obtained without intravenous contrast. Angiographic images of the head were obtained using MRA technique without contrast. COMPARISON:  Head CT same day FINDINGS: MRI HEAD FINDINGS Axial and coronal diffusion imaging only is performed. There are areas of acute infarction developing within the left MCA territory at the left posterior frontal cortical region an the left parietal cortical and subcortical region. No evidence of hemorrhage, swelling or mass effect. No hydrocephalus. No extra-axial collection. No visible changes of chronic ischemia. MRA HEAD FINDINGS Both internal carotid arteries are widely patent into the brain. No siphon stenosis. Right anterior and middle cerebral arteries are normal. Left anterior cerebral artery is normal. I think there is an occluded frontoparietal branch in the left MCA territory. Both vertebral arteries are widely patent to the basilar. No basilar stenosis. Superior cerebellar and posterior cerebral arteries are patent, with the right PCA taking a fetal origin from the anterior circulation. IMPRESSION: Developing acute infarction in the left MCA territory. Diffusion abnormality is patchy in the left posterior frontal and left parietal region. No swelling or hemorrhage visible. Occluded left M2-M3 branch. Electronically Signed   By: Paulina FusiMark  Shogry M.D.   On: 04/04/2016 09:05   Ct Head Code Stroke W/o Cm  Result Date: 04/04/2016 CLINICAL DATA:  Code stroke. Right-sided tingling. Speech difficulty. Stroke. EXAM: CT HEAD WITHOUT CONTRAST TECHNIQUE: Contiguous axial images were obtained from the base of the skull through the vertex without intravenous contrast. COMPARISON:  None. FINDINGS: Ventricle size normal. Negative for acute infarct. Negative for hemorrhage or mass. No edema identified. Negative calvarium. ASPECTS [facility]([place] Stroke Program Early CT Score) - Ganglionic level infarction (caudate, lentiform nuclei,  internal capsule, insula, M1-M3 cortex): 7 - Supraganglionic infarction (M4-M6 cortex): 3 Total score (0-10 with 10 being normal): 10 IMPRESSION: 1. Negative CT head 2. ASPECTS is 10 These results were called by telephone at the time of interpretation on 04/04/2016 at 8:18 am to Dr. Dorothy SparkShikham, who verbally acknowledged these results. Electronically Signed   By: Marlan Palauharles  Clark M.D.   On: 04/04/2016 08:18    Medications:   .  stroke: mapping our early stages of recovery book   Does not apply Once  . aspirin EC  325 mg Oral Daily  . enoxaparin (LOVENOX) injection  40 mg Subcutaneous Q24H   Continuous Infusions: . sodium chloride 75 mL/hr at 04/04/16 1219    Medical decision making is of high complexity and requires coordination of care with neurology, therefore this is a level 3 visit.     LOS: 1 day   Izsak Meir  [facility] Pager [phone]. If unable to reach me by pager, please call my cell phone at [phone].  *Please refer to  [email or web address], password [email or web address] to get updated schedule on who will round on this patient, as hospitalists switch teams weekly. If 7PM-7AM, please contact night-coverage at [email or web address], password [email or web address] for any overnight needs.  04/05/2016, 8:17 AM

## 2016-04-05 NOTE — Progress Notes (Signed)
SLP Cancellation Note  Patient Details Name: Hunter Wilson MRN: [id number] DOB: May 26, 1976   Cancelled treatment:       Reason Eval/Treat Not Completed: Patient at procedure or test/unavailable   Maxcine Ham 04/05/2016, 3:25 PM   Maxcine Ham, M.A. CCC-SLP [phone]

## 2016-04-05 NOTE — Progress Notes (Signed)
*  PRELIMINARY RESULTS* Vascular Ultrasound Transcranial Doppler with Bubbles has been completed.  High Intensity Transient Signal (HITS) were heard at rest and during Valsalva, suggestive of a medium Patent Foramen Ovale (PFO).   Bilateral lower extremity venous duplex completed. Bilateral lower extremities are negative for deep vein thrombosis. There is no evidence of Baker's cyst bilaterally.  04/05/2016 3:36 PM Gertie Fey, BS, RVT, RDCS, RDMS

## 2016-04-05 NOTE — Progress Notes (Signed)
Pt with no PCP or insurance listed. CM met with the patient and asked about his insurance and he has [employer]. Pts wife supplied card. CM copied the card and faxed the information to financial counseling. Pt also states he has a PCP. He sees Dr Sherren Mocha at [facility]. CM continuing to follow for d/c needs.

## 2016-04-05 NOTE — Progress Notes (Signed)
          TRANSCRANIAL DOPPLER BUBBLE STUDY   [name] Date of Birth:  [birth year] Medical Record Number:  [id number]   Indications: Diagnostic Date of Procedure: 04/05/2016 Clinical History:  40 year patient with embolic stroke and known h/o hole in heart Technical Description:   Transcranial Doppler Bubble Study was performed at the bedside after taking written informed consent from the patient and explaining risk/benefits. The right middle cerebral artery was insonated using a hand held probe. And IV line was inserted in the left forearm by the RN using aseptic precautions. Agitated saline injection at rest and after valsalva maneuver did t result in multiple high intensity transient signals (HITS) including incomplete curtain sign.   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   Results were explained to the patient. Questions were answered. Lower extremity venous Dopplers was ordered. Patient will be referred to interventional cardiologist Dr. Tonny Bollman for outpatient evaluation for endovascular closure after getting outpatient TEE first.. Discuss with Dr. Hillery Aldo, M.D.  Delia Heady, MD Medical Director [facility] Pager: [phone] 04/05/2016 4:16 PM

## 2016-04-05 NOTE — Progress Notes (Signed)
OT Cancellation Note  Patient Details Name: Hunter Wilson MRN: [id number] DOB: Sep 17, 1975   Cancelled Treatment:    Reason Eval/Treat Not Completed: Patient at procedure or test/ unavailable Spoke with wife present and OT will check back due to UE involvement. Pt's wife states "he keeps saying it just doesn't feel right"  Felecia Shelling   OTR/L Pager: [phone] Office: [phone] .  04/05/2016, 4:10 PM

## 2016-04-05 NOTE — Progress Notes (Signed)
Discharge instructions reviewed with patient/family. All questions answered at this time. Transport home by family.   Sande Pickert, RN 

## 2016-04-05 NOTE — Progress Notes (Signed)
  Echocardiogram 2D Echocardiogram with Definity has been performed.  Cathie Beams 04/05/2016, 12:16 PM

## 2016-04-06 LAB — HEMOGLOBIN A1C
HEMOGLOBIN A1C: 5.6 % (ref 4.8–5.6)
MEAN PLASMA GLUCOSE: 114 mg/dL

## 2016-04-08 MED ORDER — IOPAMIDOL (ISOVUE-370) INJECTION 76%
100.0000 mL | Freq: Once | INTRAVENOUS | Status: AC | PRN
  Administered 2016-04-04: 100 mL via INTRAVENOUS

## 2016-04-09 ENCOUNTER — Other Ambulatory Visit: Payer: Self-pay | Admitting: Cardiovascular Disease

## 2016-04-09 ENCOUNTER — Encounter: Payer: Self-pay | Admitting: Cardiovascular Disease

## 2016-04-09 ENCOUNTER — Ambulatory Visit (INDEPENDENT_AMBULATORY_CARE_PROVIDER_SITE_OTHER): Payer: 59 | Admitting: Cardiovascular Disease

## 2016-04-09 VITALS — BP 124/80 | HR 76 | Ht 71.0 in | Wt 248.0 lb

## 2016-04-09 DIAGNOSIS — Q211 Atrial septal defect: Secondary | ICD-10-CM

## 2016-04-09 DIAGNOSIS — Q2112 Patent foramen ovale: Secondary | ICD-10-CM

## 2016-04-09 NOTE — Patient Instructions (Signed)
Medication Instructions:  Your physician recommends that you continue on your current medications as directed. Please refer to the Current Medication list given to you today.  Labwork: No new orders.   Testing/Procedures: Your physician has requested that you have a TEE. During a TEE, sound waves are used to create images of your heart. It provides your doctor with information about the size and shape of your heart and how well your heart's chambers and valves are working. In this test, a transducer is attached to the end of a flexible tube that's guided down your throat and into your esophagus (the tube leading from you mouth to your stomach) to get a more detailed image of your heart. You are not awake for the procedure. Please see the instruction sheet given to you today. For further information please visit [email or web address]  Follow-Up: Your physician wants you to follow-up in: 3-4 MONTHS with Dr Excell Seltzer.  You will receive a reminder letter in the mail two months in advance. If you don't receive a letter, please call our office to schedule the follow-up appointment.   Any Other Special Instructions Will Be Listed Below (If Applicable).     If you need a refill on your cardiac medications before your next appointment, please call your pharmacy.

## 2016-04-09 NOTE — Progress Notes (Signed)
Cardiology Office Note Date:  04/09/2016   ID:  Hunter Wilson, DOB 07/14/1976, MRN [id number]  PCP:  Deneen Harts, FNP  Cardiologist:  Tonny Bollman, MD   Referring: Dr Pearlean Brownie  Chief Complaint  Patient presents with  . PFO consult    no sx     History of Present Illness: Hunter Wilson is a 40 y.o. 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   He was told when he was young that he has 'a hole in his heart' but never required any further testing or treatment. He was able to participate in athletic activities as a child without limitations. He has never had problems with heart palpitations, shortness of breath with exertion, lightheadedness, syncope, or edema.   He woke up one morning last week and felt like his right arm was 'asleep.' Symptoms did not resolve after several minutes and he then noticed that the right side of his mouth was drooping and he couldn't speak. His wife brought him to the fire department and EMS transported him to the hospital for evaluation of stroke. Symptoms slowly improved and he did not require thrombolytic therapy. He was noted to have normal LV function without valvular abnormalities on 2D echo. He was found to have significant intracardiac shunt on transcranial doppler. He was noted to have a filling defect at the origin of the left carotid artery on CTA but an MRA also performed in the hospital was interpreted as normal.   The patient has a history of arthritis and has been treated with methotrexate. This has been discontinued because of increased LFT's. Humira has now been discontinued as well at least until his stroke evaluation is completed.   Past Medical History:  Diagnosis Date  . Acute cerebrovascular accident (CVA) (HCC) 04/04/2016  . Arthritis   . 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 Cerebral infarction due to unspecified mechanism   . 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  Hyperlipidemia 04/05/2016  . Rheumatoid arthritis (HCC) 07/18/2015    History reviewed. No pertinent surgical history.  Current Outpatient Prescriptions  Medication Sig Dispense Refill  . aspirin EC 325 MG EC tablet Take 1 tablet (325 mg total) by mouth daily. 30 tablet 0  . atorvastatin (LIPITOR) 20 MG tablet Take 1 tablet (20 mg total) by mouth daily at 6 PM. 30 tablet 2  . clopidogrel (PLAVIX) 75 MG tablet Take 1 tablet (75 mg total) by mouth daily. 30 tablet 2  . Adalimumab (HUMIRA) 40 MG/0.8ML PSKT Inject 40 mg into the skin See admin instructions. Every other Wednesday     No current facility-administered medications for this visit.     Allergies:   Mold extract [trichophyton]   Social History:  The patient  reports that he has never smoked. He has never used smokeless tobacco. He reports that he does not drink alcohol or use drugs.   Family History:  The patient's family hx is negative for premature atherosclerosis, vascular disease, or stroke/MI.  ROS:  Please see the history of present illness.  All other systems are reviewed and negative.   PHYSICAL EXAM: VS:  BP 124/80   Pulse 76   Ht 5\' 11"  (1.803 m)   Wt 112.5 kg (248 lb)   SpO2 98%   BMI 34.59 kg/m  , BMI Body mass index is 34.59 kg/m. GEN: Well nourished, well developed, in no acute distress  HEENT: normal  Neck: no JVD, no masses. No carotid bruits Cardiac:  RRR with 2/6 systolic murmur at the LSB             Respiratory:  clear to auscultation bilaterally, normal work of breathing GI: soft, nontender, nondistended, + BS MS: no deformity or atrophy  Ext: no pretibial edema, pedal pulses 2+= bilaterally Skin: warm and dry, no rash Neuro:  Strength and sensation are intact Psych: euthymic mood, full affect  EKG:  EKG is not ordered today.  Recent Labs: 04/04/2016: ALT 91; BUN 18; Creatinine, Ser 1.00; Hemoglobin 16.0; Platelets 258; Potassium 4.3; Sodium 143   Lipid Panel     Component Value Date/Time    CHOL 171 04/05/2016 0641   TRIG 94 04/05/2016 0641   HDL 36 (L) 04/05/2016 0641   CHOLHDL 4.8 04/05/2016 0641   VLDL 19 04/05/2016 0641   LDLCALC 116 (H) 04/05/2016 0641      Wt Readings from Last 3 Encounters:  04/09/16 112.5 kg (248 lb)  04/04/16 112.1 kg (247 lb 2.2 oz)     Cardiac Studies Reviewed: MRI/MRA Brain: FINDINGS: MRI HEAD FINDINGS  Axial and coronal diffusion imaging only is performed. There are areas of acute infarction developing within the left MCA territory at the left posterior frontal cortical region an the left parietal cortical and subcortical region. No evidence of hemorrhage, swelling or mass effect. No hydrocephalus. No extra-axial collection. No visible changes of chronic ischemia.  MRA HEAD FINDINGS  Both internal carotid arteries are widely patent into the brain. No siphon stenosis. Right anterior and middle cerebral arteries are normal. Left anterior cerebral artery is normal. I think there is an occluded frontoparietal branch in the left MCA territory.  Both vertebral arteries are widely patent to the basilar. No basilar stenosis. Superior cerebellar and posterior cerebral arteries are patent, with the right PCA taking a fetal origin from the anterior circulation.  IMPRESSION: Developing acute infarction in the left MCA territory. Diffusion abnormality is patchy in the left posterior frontal and left parietal region. No swelling or hemorrhage visible.  Occluded left M2-M3 branch.  CTA: IMPRESSION: 1. Focal filling defect at the origin of the left internal carotid artery is concerning for a dissection flap. 2. The more distal cervical left ICA is normal. 3. No significant proximal or mid MCA branch vessel occlusion is evident. 4. The right anterior and bilateral posterior circulation is normal. These results were called by telephone at the time of interpretation on 04/04/2016 at 10:01 am to Dr. Lenise Herald , who  verbally acknowledged these results.  Duplex Scan: Summary:  - No evidence of deep vein thrombosis involving the right lower   extremity and left lower extremity. - No evidence of Baker&'s cyst on the right or left.  Transcranial Doppler: Impression:  Positive  Transcranial Doppler Bubble Study indicative  of  medium size right to left intracardiac shunt.   Results were explained to the patient. Questions were answered. Lower extremity venous Dopplers was ordered. Patient will be referred to interventional cardiologist Dr. Tonny Bollman for outpatient evaluation for endovascular closure after getting outpatient TEE first.  2D Echo 04-05-2016: Study Conclusions  - Left ventricle: The cavity size was normal. Systolic function was   normal. The estimated ejection fraction was in the range of 55%   to 60%. Wall motion was normal; there were no regional wall   motion abnormalities. Left ventricular diastolic function   parameters were normal. Acoustic contrast opacification revealed   no evidence of thrombus.  ASSESSMENT AND PLAN: Cryptogenic stroke with evidence of intracardiac  right-to-left shunt by transcranial doppler study. Based on the patient's history, he likely has a small ASD. There is no evidence of right-sided cardiac chamber enlargement or pulmonary hypertension on 2D echo, so an ASD with hemodynamically significant shunt is unlikely. However, the patient may still be at risk of paradoxical embolism. I reviewed his echo, CTA, MRA, and doppler studies as part of his evaluation today. I have recommended a TEE to better define his interatrial septal anatomy and assess anatomic suitability for transcatheter closure. We reviewed the risks, indications, and expectations with TEE today. The patient and his wife are counseled at length.  We discussed aspects of transcatheter closure in detail. We reviewed this in the context of his recent stroke and also the questionable finding of a  spontaneous left carotid artery dissection. I also discussed his case with Dr Pearlean Brownie who has recommended continuation of ASA and plavix with repeat CTA imaging in 3 months.   Will arrange FU after the patient's TEE study to review results and implications for device closure. All questions answered today. His medications are reviewed and will be continued without change (ASA, plavix, statin).  Current medicines are reviewed with the patient today.  The patient does not have concerns regarding medicines.  Labs/ tests ordered today include:  No orders of the defined types were placed in this encounter.  Disposition:   FU pending TEE results  Signed, Tonny Bollman, MD  04/09/2016 3:28 PM    [facility] [address] [place], [place], [place]  [address] Phone: [phone]; Fax: [phone]

## 2016-04-10 ENCOUNTER — Telehealth: Payer: Self-pay | Admitting: Cardiovascular Disease

## 2016-04-10 NOTE — Telephone Encounter (Signed)
New message       Pt was seen 04-09-16.  He want to make sure the ov notes are sent to his PCP, Dr Deneen Harts

## 2016-04-10 NOTE — Telephone Encounter (Signed)
Left pt a detail  Message and to call back.

## 2016-04-11 LAB — PROTEIN C ACTIVITY: PROTEIN C ACTIVITY: 102 % (ref 73–180)

## 2016-04-11 LAB — FACTOR 5 LEIDEN

## 2016-04-11 LAB — PROTHROMBIN GENE MUTATION

## 2016-04-11 LAB — CARDIOLIPIN ANTIBODIES, IGG, IGM, IGA
Anticardiolipin IgA: <9 APL U/mL (ref 0–11)
Anticardiolipin IgG: <9 GPL U/mL (ref 0–14)
Anticardiolipin IgM: 14 MPL U/mL — ABNORMAL HIGH (ref 0–12)

## 2016-04-11 LAB — LUPUS ANTICOAGULANT PANEL
DRVVT: 41.7 s (ref 0.0–47.0)
PTT Lupus Anticoagulant: 30.5 s (ref 0.0–51.9)

## 2016-04-11 LAB — BETA-2-GLYCOPROTEIN I ABS, IGG/M/A: Beta-2 Glyco I IgG: <9 GPI IgG units (ref 0–20)

## 2016-04-11 LAB — PROTEIN C, TOTAL: Protein C, Total: 69 % (ref 60–150)

## 2016-04-11 LAB — PROTEIN S, TOTAL: Protein S Ag, Total: 102 % (ref 60–150)

## 2016-04-11 LAB — PROTEIN S ACTIVITY: PROTEIN S ACTIVITY: 139 % (ref 63–140)

## 2016-04-11 NOTE — Telephone Encounter (Signed)
Notes routed to PCP

## 2016-05-08 ENCOUNTER — Ambulatory Visit (HOSPITAL_COMMUNITY)
Admission: RE | Admit: 2016-05-08 | Discharge: 2016-05-08 | Disposition: A | Discharge status: Home / Self Care | Payer: 59 | Source: Ambulatory Visit | Attending: Cardiovascular Disease | Admitting: Cardiovascular Disease

## 2016-05-08 ENCOUNTER — Encounter (HOSPITAL_COMMUNITY)
Admission: RE | Disposition: A | Discharge status: Home / Self Care | Payer: Self-pay | Source: Ambulatory Visit | Attending: Cardiovascular Disease

## 2016-05-08 ENCOUNTER — Encounter (HOSPITAL_COMMUNITY): Payer: Self-pay | Admitting: *Deleted

## 2016-05-08 ENCOUNTER — Ambulatory Visit (HOSPITAL_BASED_OUTPATIENT_CLINIC_OR_DEPARTMENT_OTHER)
Admission: RE | Admit: 2016-05-08 | Discharge: 2016-05-08 | Disposition: A | Discharge status: Home / Self Care | Payer: 59 | Source: Ambulatory Visit | Attending: Cardiovascular Disease | Admitting: Cardiovascular Disease

## 2016-05-08 DIAGNOSIS — M069 Rheumatoid arthritis, unspecified: Secondary | ICD-10-CM | POA: Diagnosis not present

## 2016-05-08 DIAGNOSIS — Q211 Atrial septal defect: Secondary | ICD-10-CM | POA: Diagnosis not present

## 2016-05-08 DIAGNOSIS — Z7902 Long term (current) use of antithrombotics/antiplatelets: Secondary | ICD-10-CM | POA: Diagnosis not present

## 2016-05-08 DIAGNOSIS — I639 Cerebral infarction, unspecified: Secondary | ICD-10-CM

## 2016-05-08 DIAGNOSIS — M1990 Unspecified osteoarthritis, unspecified site: Secondary | ICD-10-CM | POA: Insufficient documentation

## 2016-05-08 DIAGNOSIS — Z136 Encounter for screening for cardiovascular disorders: Secondary | ICD-10-CM | POA: Diagnosis present

## 2016-05-08 DIAGNOSIS — E785 Hyperlipidemia, unspecified: Secondary | ICD-10-CM | POA: Insufficient documentation

## 2016-05-08 DIAGNOSIS — Z7982 Long term (current) use of aspirin: Secondary | ICD-10-CM | POA: Diagnosis not present

## 2016-05-08 DIAGNOSIS — Z8673 Personal history of transient ischemic attack (TIA), and cerebral infarction without residual deficits: Secondary | ICD-10-CM | POA: Insufficient documentation

## 2016-05-08 DIAGNOSIS — Z79899 Other long term (current) drug therapy: Secondary | ICD-10-CM | POA: Insufficient documentation

## 2016-05-08 HISTORY — DX: Family history of other specified conditions: Z84.89

## 2016-05-08 HISTORY — PX: TEE WITHOUT CARDIOVERSION: SHX5443

## 2016-05-08 SURGERY — ECHOCARDIOGRAM, TRANSESOPHAGEAL
Anesthesia: Moderate Sedation

## 2016-05-08 MED ORDER — SODIUM CHLORIDE 0.9 % IV SOLN
INTRAVENOUS | Status: DC
  Administered 2016-05-08: 09:00:00 via INTRAVENOUS

## 2016-05-08 MED ORDER — MIDAZOLAM HCL 10 MG/2ML IJ SOLN
INTRAMUSCULAR | Status: DC | PRN
  Administered 2016-05-08 (×2): 2 mg via INTRAVENOUS

## 2016-05-08 MED ORDER — HYDRALAZINE HCL 20 MG/ML IJ SOLN
INTRAMUSCULAR | Status: AC
  Filled 2016-05-08: qty 1

## 2016-05-08 MED ORDER — HYDRALAZINE HCL 20 MG/ML IJ SOLN
INTRAMUSCULAR | Status: DC | PRN
  Administered 2016-05-08 (×2): 10 mg via INTRAVENOUS

## 2016-05-08 MED ORDER — FENTANYL CITRATE (PF) 100 MCG/2ML IJ SOLN
INTRAMUSCULAR | Status: AC
  Filled 2016-05-08: qty 2

## 2016-05-08 MED ORDER — MIDAZOLAM HCL 5 MG/ML IJ SOLN
INTRAMUSCULAR | Status: AC
  Filled 2016-05-08: qty 2

## 2016-05-08 MED ORDER — BUTAMBEN-TETRACAINE-BENZOCAINE 2-2-14 % EX AERO
INHALATION_SPRAY | CUTANEOUS | Status: DC | PRN
  Administered 2016-05-08: 2 via TOPICAL

## 2016-05-08 MED ORDER — FENTANYL CITRATE (PF) 100 MCG/2ML IJ SOLN
INTRAMUSCULAR | Status: DC | PRN
  Administered 2016-05-08: 50 ug via INTRAVENOUS
  Administered 2016-05-08: 25 ug via INTRAVENOUS

## 2016-05-08 NOTE — H&P (View-Only) (Signed)
 Cardiology Office Note Date:  04/09/2016   ID:  Hunter Wilson, DOB 03/29/1976, MRN [id number]  PCP:  TODD,ELIZABETH, FNP  Cardiologist:  Patches Mcdonnell, MD   Referring: Dr Sethi  Chief Complaint  Patient presents with  . PFO consult    no sx     History of Present Illness: Hunter Wilson is a 40 y.o. 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   He was told when he was young that he has 'a hole in his heart' but never required any further testing or treatment. He was able to participate in athletic activities as a child without limitations. He has never had problems with heart palpitations, shortness of breath with exertion, lightheadedness, syncope, or edema.   He woke up one morning last week and felt like his right arm was 'asleep.' Symptoms did not resolve after several minutes and he then noticed that the right side of his mouth was drooping and he couldn't speak. His wife brought him to the fire department and EMS transported him to the hospital for evaluation of stroke. Symptoms slowly improved and he did not require thrombolytic therapy. He was noted to have normal LV function without valvular abnormalities on 2D echo. He was found to have significant intracardiac shunt on transcranial doppler. He was noted to have a filling defect at the origin of the left carotid artery on CTA but an MRA also performed in the hospital was interpreted as normal.   The patient has a history of arthritis and has been treated with methotrexate. This has been discontinued because of increased LFT's. Humira has now been discontinued as well at least until his stroke evaluation is completed.   Past Medical History:  Diagnosis Date  . Acute cerebrovascular accident (CVA) (HCC) 04/04/2016  . Arthritis   . 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 Cerebral infarction due to unspecified mechanism   . 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  Hyperlipidemia 04/05/2016  . Rheumatoid arthritis (HCC) 07/18/2015    History reviewed. No pertinent surgical history.  Current Outpatient Prescriptions  Medication Sig Dispense Refill  . aspirin EC 325 MG EC tablet Take 1 tablet (325 mg total) by mouth daily. 30 tablet 0  . atorvastatin (LIPITOR) 20 MG tablet Take 1 tablet (20 mg total) by mouth daily at 6 PM. 30 tablet 2  . clopidogrel (PLAVIX) 75 MG tablet Take 1 tablet (75 mg total) by mouth daily. 30 tablet 2  . Adalimumab (HUMIRA) 40 MG/0.8ML PSKT Inject 40 mg into the skin See admin instructions. Every other Wednesday     No current facility-administered medications for this visit.     Allergies:   Mold extract [trichophyton]   Social History:  The patient  reports that he has never smoked. He has never used smokeless tobacco. He reports that he does not drink alcohol or use drugs.   Family History:  The patient's family hx is negative for premature atherosclerosis, vascular disease, or stroke/MI.  ROS:  Please see the history of present illness.  All other systems are reviewed and negative.   PHYSICAL EXAM: VS:  BP 124/80   Pulse 76   Ht 5' 11" (1.803 m)   Wt 112.5 kg (248 lb)   SpO2 98%   BMI 34.59 kg/m  , BMI Body mass index is 34.59 kg/m. GEN: Well nourished, well developed, in no acute distress  HEENT: normal  Neck: no JVD, no masses. No carotid bruits Cardiac:   RRR with 2/6 systolic murmur at the LSB             Respiratory:  clear to auscultation bilaterally, normal work of breathing GI: soft, nontender, nondistended, + BS MS: no deformity or atrophy  Ext: no pretibial edema, pedal pulses 2+= bilaterally Skin: warm and dry, no rash Neuro:  Strength and sensation are intact Psych: euthymic mood, full affect  EKG:  EKG is not ordered today.  Recent Labs: 04/04/2016: ALT 91; BUN 18; Creatinine, Ser 1.00; Hemoglobin 16.0; Platelets 258; Potassium 4.3; Sodium 143   Lipid Panel     Component Value Date/Time    CHOL 171 04/05/2016 0641   TRIG 94 04/05/2016 0641   HDL 36 (L) 04/05/2016 0641   CHOLHDL 4.8 04/05/2016 0641   VLDL 19 04/05/2016 0641   LDLCALC 116 (H) 04/05/2016 0641      Wt Readings from Last 3 Encounters:  04/09/16 112.5 kg (248 lb)  04/04/16 112.1 kg (247 lb 2.2 oz)     Cardiac Studies Reviewed: MRI/MRA Brain: FINDINGS: MRI HEAD FINDINGS  Axial and coronal diffusion imaging only is performed. There are areas of acute infarction developing within the left MCA territory at the left posterior frontal cortical region an the left parietal cortical and subcortical region. No evidence of hemorrhage, swelling or mass effect. No hydrocephalus. No extra-axial collection. No visible changes of chronic ischemia.  MRA HEAD FINDINGS  Both internal carotid arteries are widely patent into the brain. No siphon stenosis. Right anterior and middle cerebral arteries are normal. Left anterior cerebral artery is normal. I think there is an occluded frontoparietal branch in the left MCA territory.  Both vertebral arteries are widely patent to the basilar. No basilar stenosis. Superior cerebellar and posterior cerebral arteries are patent, with the right PCA taking a fetal origin from the anterior circulation.  IMPRESSION: Developing acute infarction in the left MCA territory. Diffusion abnormality is patchy in the left posterior frontal and left parietal region. No swelling or hemorrhage visible.  Occluded left M2-M3 branch.  CTA: IMPRESSION: 1. Focal filling defect at the origin of the left internal carotid artery is concerning for a dissection flap. 2. The more distal cervical left ICA is normal. 3. No significant proximal or mid MCA branch vessel occlusion is evident. 4. The right anterior and bilateral posterior circulation is normal. These results were called by telephone at the time of interpretation on 04/04/2016 at 10:01 am to Dr. Lenise Herald , who  verbally acknowledged these results.  Duplex Scan: Summary:  - No evidence of deep vein thrombosis involving the right lower   extremity and left lower extremity. - No evidence of Baker&'s cyst on the right or left.  Transcranial Doppler: Impression:  Positive  Transcranial Doppler Bubble Study indicative  of  medium size right to left intracardiac shunt.   Results were explained to the patient. Questions were answered. Lower extremity venous Dopplers was ordered. Patient will be referred to interventional cardiologist Dr. Tonny Bollman for outpatient evaluation for endovascular closure after getting outpatient TEE first.  2D Echo 04-05-2016: Study Conclusions  - Left ventricle: The cavity size was normal. Systolic function was   normal. The estimated ejection fraction was in the range of 55%   to 60%. Wall motion was normal; there were no regional wall   motion abnormalities. Left ventricular diastolic function   parameters were normal. Acoustic contrast opacification revealed   no evidence of thrombus.  ASSESSMENT AND PLAN: Cryptogenic stroke with evidence of intracardiac  right-to-left shunt by transcranial doppler study. Based on the patient's history, he likely has a small ASD. There is no evidence of right-sided cardiac chamber enlargement or pulmonary hypertension on 2D echo, so an ASD with hemodynamically significant shunt is unlikely. However, the patient may still be at risk of paradoxical embolism. I reviewed his echo, CTA, MRA, and doppler studies as part of his evaluation today. I have recommended a TEE to better define his interatrial septal anatomy and assess anatomic suitability for transcatheter closure. We reviewed the risks, indications, and expectations with TEE today. The patient and his wife are counseled at length.  We discussed aspects of transcatheter closure in detail. We reviewed this in the context of his recent stroke and also the questionable finding of a  spontaneous left carotid artery dissection. I also discussed his case with Dr Pearlean Brownie who has recommended continuation of ASA and plavix with repeat CTA imaging in 3 months.   Will arrange FU after the patient's TEE study to review results and implications for device closure. All questions answered today. His medications are reviewed and will be continued without change (ASA, plavix, statin).  Current medicines are reviewed with the patient today.  The patient does not have concerns regarding medicines.  Labs/ tests ordered today include:  No orders of the defined types were placed in this encounter.  Disposition:   FU pending TEE results  Signed, Tonny Bollman, MD  04/09/2016 3:28 PM    [facility] [address] [place], [place], [place]  [address] Phone: [phone]; Fax: [phone]

## 2016-05-08 NOTE — Interval H&P Note (Signed)
History and Physical Interval Note:  05/08/2016 8:23 AM  Hunter Wilson  has presented today for surgery, with the diagnosis of PFO  The various methods of treatment have been discussed with the patient and family. After consideration of risks, benefits and other options for treatment, the patient has consented to  Procedure(s): TRANSESOPHAGEAL ECHOCARDIOGRAM (TEE) (N/A) as a surgical intervention .  The patient's history has been reviewed, patient examined, no change in status, stable for surgery.  I have reviewed the patient's chart and labs.  Questions were answered to the patient's satisfaction.     Charlton Haws

## 2016-05-08 NOTE — Discharge Instructions (Signed)
TEE ° °YOU HAD AN CARDIAC PROCEDURE TODAY: Refer to the procedure report and other information in the discharge instructions given to you for any specific questions about what was found during the examination. If this information does not answer your questions, please call [facility] office at [phone] to clarify.  ° °DIET: Your first meal following the procedure should be a light meal and then it is ok to progress to your normal diet. A half-sandwich or bowl of soup is an example of a good first meal. Heavy or fried foods are harder to digest and may make you feel nauseous or bloated. Drink plenty of fluids but you should avoid alcoholic beverages for 24 hours. If you had a esophageal dilation, please see attached instructions for diet.  ° °ACTIVITY: Your care partner should take you home directly after the procedure. You should plan to take it easy, moving slowly for the rest of the day. You can resume normal activity the day after the procedure however YOU SHOULD NOT DRIVE, use power tools, machinery or perform tasks that involve climbing or major physical exertion for 24 hours (because of the sedation medicines used during the test).  ° °SYMPTOMS TO REPORT IMMEDIATELY: °A cardiologist can be reached at any hour. Please call [phone] for any of the following symptoms:  °Vomiting of blood or coffee ground material  °New, significant abdominal pain  °New, significant chest pain or pain under the shoulder blades  °Painful or persistently difficult swallowing  °New shortness of breath  °Black, tarry-looking or red, bloody stools ° °FOLLOW UP:  °Please also call with any specific questions about appointments or follow up tests. ° °Moderate Conscious Sedation, Adult, Care After °Refer to this sheet in the next few weeks. These instructions provide you with information on caring for yourself after your procedure. Your health care provider may also give you more specific instructions. Your treatment has been  planned according to current medical practices, but problems sometimes occur. Call your health care provider if you have any problems or questions after your procedure. °WHAT TO EXPECT AFTER THE PROCEDURE  °After your procedure: °· You may feel sleepy, clumsy, and have poor balance for several hours. °· Vomiting may occur if you eat too soon after the procedure. °HOME CARE INSTRUCTIONS °· Do not participate in any activities where you could become injured for at least 24 hours. Do not: °¨ Drive. °¨ Swim. °¨ Ride a bicycle. °¨ Operate heavy machinery. °¨ Cook. °¨ Use power tools. °¨ Climb ladders. °¨ Work from a high place. °· Do not make important decisions or sign legal documents until you are improved. °· If you vomit, drink water, juice, or soup when you can drink without vomiting. Make sure you have little or no nausea before eating solid foods. °· Only take over-the-counter or prescription medicines for pain, discomfort, or fever as directed by your health care provider. °· Make sure you and your family fully understand everything about the medicines given to you, including what side effects may occur. °· You should not drink alcohol, take sleeping pills, or take medicines that cause drowsiness for at least 24 hours. °· If you smoke, do not smoke without supervision. °· If you are feeling better, you may resume normal activities 24 hours after you were sedated. °· Keep all appointments with your health care provider. °SEEK MEDICAL CARE IF: °· Your skin is pale or bluish in color. °· You continue to feel nauseous or vomit. °· Your pain is getting   worse and is not helped by medicine. °· You have bleeding or swelling. °· You are still sleepy or feeling clumsy after 24 hours. °SEEK IMMEDIATE MEDICAL CARE IF: °· You develop a rash. °· You have difficulty breathing. °· You develop any type of allergic problem. °· You have a fever. °MAKE SURE YOU: °· Understand these instructions. °· Will watch your condition. °· Will  get help right away if you are not doing well or get worse. °  °This information is not intended to replace advice given to you by your health care provider. Make sure you discuss any questions you have with your health care provider. °  °Document Released: 05/05/2013 Document Revised: 08/05/2014 Document Reviewed: 05/05/2013 °Elsevier Interactive Patient Education ©2016 [employer] ° °

## 2016-05-08 NOTE — CV Procedure (Signed)
During this procedure the patient is administered a total of Versed 4 mg and Fentanyl 75 mg to achieve and maintain moderate conscious sedation.  The patient's heart rate, blood pressure, and oxygen saturation are monitored continuously during the procedure. The period of conscious sedation is 44 minutes, of which I was present face-to-face 100% of this time. HTN during case received 20 mg total of hydralazine   See full note in [employer]  Small PFO less tha 2.5 mm  Positive bubble only with valsalva small  Discussed with Dr Excell Seltzer

## 2016-05-08 NOTE — Progress Notes (Signed)
*  PRELIMINARY RESULTS* Echocardiogram Echocardiogram Transesophageal has been performed.  Jeryl Columbia 05/08/2016, 11:16 AM

## 2016-05-09 ENCOUNTER — Encounter (HOSPITAL_COMMUNITY): Payer: Self-pay | Admitting: Cardiovascular Disease

## 2016-06-19 ENCOUNTER — Other Ambulatory Visit: Payer: Self-pay

## 2016-06-19 DIAGNOSIS — Z8679 Personal history of other diseases of the circulatory system: Secondary | ICD-10-CM

## 2016-07-04 ENCOUNTER — Encounter: Payer: Self-pay | Admitting: Neurology

## 2016-07-04 ENCOUNTER — Ambulatory Visit (INDEPENDENT_AMBULATORY_CARE_PROVIDER_SITE_OTHER)
Admission: RE | Admit: 2016-07-04 | Discharge: 2016-07-04 | Disposition: A | Discharge status: Home / Self Care | Payer: 59 | Source: Ambulatory Visit | Attending: Cardiovascular Disease | Admitting: Cardiovascular Disease

## 2016-07-04 ENCOUNTER — Ambulatory Visit (INDEPENDENT_AMBULATORY_CARE_PROVIDER_SITE_OTHER): Payer: 59 | Admitting: Neurology

## 2016-07-04 VITALS — BP 145/94 | HR 83 | Ht 72.0 in | Wt 255.0 lb

## 2016-07-04 DIAGNOSIS — I7771 Dissection of carotid artery: Secondary | ICD-10-CM

## 2016-07-04 DIAGNOSIS — Z8679 Personal history of other diseases of the circulatory system: Secondary | ICD-10-CM | POA: Diagnosis not present

## 2016-07-04 MED ORDER — IOPAMIDOL (ISOVUE-370) INJECTION 76%
50.0000 mL | Freq: Once | INTRAVENOUS | Status: AC | PRN
  Administered 2016-07-04: 50 mL via INTRAVENOUS

## 2016-07-04 NOTE — Patient Instructions (Signed)
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 Repeat CT angiogram done today shows stable dissection without significant stenosis or flow reduction.. I recommend he continue aspirin and Plavix for 6 more months and we will repeat follow-up CT angiogram and that time. I also counseled him not to have endovascular PFO closure at the present time unless he has recurrent strokes in the future. Advise him to maintain strict control of hypertension with blood pressure goal below 140/90 and lipids with LDL cholesterol goal below 70 mg percent. He was also encouraged to eat a healthy diet and be active and not gain weight. He'll return for follow-up in 6 months or call earlier if necessary   Carotid Artery Dissection Carotid artery dissection is a tear in a carotid artery. The carotid arteries are blood vessels on each side of the neck. They carry blood from the heart to the brain and other parts of the head. When an artery tears, blood collects inside the layers of the artery wall. This can cause a blood clot. This condition increases the risk of stroke if it is not diagnosed and treated right away. What are the causes? For many people, the cause of this condition is not known. It may be caused by:  A neck injury due to sudden or excessive neck movement. This is called a traumatic dissection.  Having weak blood vessel walls. The walls may tear even when no injury occurs (spontaneous dissection).  An angiogram procedure. This is a rare cause of carotid artery dissection. What increases the risk? Carotid artery dissection is more likely to develop in people who are 76-72 years old. You may be more likely to have a spontaneous carotid artery dissection if you:  Have high blood pressure.  Have a migraine disorder.  Have certain inherited diseases or connective tissue disorders that weaken the blood vessels.  Have fibromuscular  dysplasia.  Abuse amphetamine drugs.  Have had an artery dissection in the past. What are the signs or symptoms? Symptoms of carotid artery dissection are similar to signs of a stroke. Symptoms may include:  Headache.  Face or neck pain.  Changes in vision.  Weakness on one side of the face or body.  Drooping eyelid.  Loss of taste.  Ringing in the ear. How is this diagnosed? This condition is diagnosed with tests, such as:  CT angiogram. This test uses a computer to take X-rays of your carotid arteries. A dye may be injected into your blood to show the inside of your blood vessels more clearly.  MRI angiogram. This is a type of MRI that is used to evaluate the blood vessels.  Cerebral angiogram. This test uses X-rays and a dye to show the blood vessels in the brain and neck.  Doppler ultrasound. This test uses sound waves to show the carotid arteries. The test will also show how well blood flows through your arteries. How is this treated? Treatment for this condition depends on the cause of your carotid artery dissection and your overall health. The most important goal of treatment is to prevent a stroke. If you are having a stroke, it is important to get treatment quickly. Treatment may include:  Blood thinners. This medicine helps to prevent blood clots. This may be given first through an IV tube, and then as pills for 3-6 months.  Procedures to widen a narrow blood vessel (angioplasty) or to place a mesh tube (stent) inside  the blood vessel to keep it open.  Surgery to repair the area. This is rarely needed. Follow these instructions at home:  Work with your health care provider to control your blood pressure. This may involve:  Exercising regularly, as told by your health care provider. Check with your health care provider before starting any new type of exercise.  Eating a heart-healthy diet. This includes limiting unhealthy fats and eating more healthy  fats.  Reducing the amount of salt (sodium) that you eat to less than 1,500 mg a day.  Reducing stress by participating in things that you enjoy and avoiding things that cause you stress.  Avoid activities that put you at risk for neck injuries, such as contact sports.  Take over-the-counter and prescription medicines only as told by your health care provider.  Do not use any tobacco products, such as cigarettes, chewing tobacco, and e-cigarettes. If you need help quitting, ask your health care provider.  Keep all follow-up visits as told by your health care provider. This is important. Get help right away if:  You feel weak or dizzy.  You have a sudden, severe headache with no known cause.  You notice changes in your vision or speech.  You have difficulty breathing.  You have a loss of balance or coordination.  You have numbness in your face, arm, or leg.  You have chest pain.  You have a fever. These symptoms may represent a serious problem that is an emergency. Do not wait to see if the symptoms will go away. Get medical help right away. Call your local emergency services (911 in the [place] Do not drive yourself to the hospital.  This information is not intended to replace advice given to you by your health care provider. Make sure you discuss any questions you have with your health care provider. Document Released: 10/07/2011 Document Revised: 12/26/2015 Document Reviewed: 10/11/2015 Elsevier Interactive Patient Education  2017 [employer].

## 2016-07-05 NOTE — Progress Notes (Signed)
[facility] [address] [place]. [place] [address] [phone]       OFFICE FOLLOW-UP NOTE  [name] Date of Birth:  November 09, 1975 Medical Record Number:  [id number]   HPI: Mr Cajamarca is a 40 year Caucasian male seen today for first office follow-up visit following hospital admission for stroke in September 2017. Raef Collinsis an 40 y.o. male with hx of RA on humara who presents with waking up with R arm tingling and numbness followed by word find difficulties. Date last known well: 04/03/16 Time last known well: last night before bed tPA Given:No: wake up stroke. CT angiogram of the head and neck showed a focal filling defect at the origin of left internal carotid artery concerning for a dissection flap but there was no significant flow limitation no stenosis. MRI scan showed an acute infarct in the left MCA territory in the posterior frontal and parietal region with an occluded left M2 and M3 branches on the MRA. CT perfusion did  show penumbra volume of 62 mL but the patient's distal branch occlusion was felt not amenable to endovascular treatment. Patient did well and his speech improved. He was started on aspirin and Plavix for his focal dissection. He had a known history of hole in the heart and hence transcranial Doppler bubble study was performed which was positive for medium size right to left intracardiac shunt. He subsequently had outpatient transesophageal echocardiogram performed by Dr. Eden Emms on 05/08/16 which confirmed a small PFO. Patient was seen by Dr. Tonny Bollman in the hospital for PFO closure but the final decision will be made after follow-up CT angiogram which actually had today which I have personally reviewed and shows persistent proximal left carotid artery dissection flap but without thrombus or flow limiting stenosis. Patient does have rheumatoid arthritis but that seems to be fairly controlled on Humira and methotrexate. He denies  high-risk behavior with repetitive neck injuries. He is tolerating his dual antiplatelets with minor bruising and no bleeding. He is taking Lipitor regularly without side effects as well. ROS:   14 system review of systems is positive for  fatigue, easy bruising and bleeding, joint pain and swelling, headache and all other systems negative PMH:  Past Medical History:  Diagnosis Date  . Acute cerebrovascular accident (CVA) (HCC) 04/04/2016  . Arthritis   . 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 Cerebral infarction due to unspecified mechanism   . 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 Family history of adverse reaction to anesthesia    mom has postop n/v  . Hyperlipidemia 04/05/2016  . Rheumatoid arthritis (HCC) 07/18/2015    Social History:  Social History   Social History  . Marital status: Married    Spouse name: N/A  . Number of children: N/A  . Years of education: N/A   Occupational History  . Not on file.   Social History Main Topics  . Smoking status: Never Smoker  . Smokeless tobacco: Never Used  . Alcohol use No  . Drug use: No  . Sexual activity: Not on file   Other Topics Concern  . Not on file   Social History Narrative  . No narrative on file    Medications:   Current Outpatient Prescriptions on File Prior to Visit  Medication Sig Dispense Refill  . Adalimumab (HUMIRA) 40 MG/0.8ML PSKT Inject 40 mg into the skin See admin instructions. Every other Wednesday    . aspirin EC 325 MG EC tablet Take 1  tablet (325 mg total) by mouth daily. 30 tablet 0  . atorvastatin (LIPITOR) 20 MG tablet Take 1 tablet (20 mg total) by mouth daily at 6 PM. 30 tablet 2  . clopidogrel (PLAVIX) 75 MG tablet Take 1 tablet (75 mg total) by mouth daily. 30 tablet 2   No current facility-administered medications on file prior to visit.     Allergies:   Allergies  Allergen Reactions  . Mold Extract [Trichophyton]     Unknown     Physical  Exam General: well developed, well nourished, seated, in no evident distress Head: head normocephalic and atraumatic.  Neck: supple with no carotid or supraclavicular bruits Cardiovascular: regular rate and rhythm, no murmurs Musculoskeletal: no deformity Skin:  no rash/petichiae Vascular:  Normal pulses all extremities Vitals:   07/04/16 1602  BP: (!) 145/94  Pulse: 83   Neurologic Exam Mental Status: Awake and fully alert. Oriented to place and time. Recent and remote memory intact. Attention span, concentration and fund of knowledge appropriate. Mood and affect appropriate.  Cranial Nerves: Fundoscopic exam reveals sharp disc margins. Pupils equal, briskly reactive to light. Extraocular movements full without nystagmus. Visual fields full to confrontation. Hearing intact. Facial sensation intact. Face, tongue, palate moves normally and symmetrically.  Motor: Normal bulk and tone. Normal strength in all tested extremity muscles. Sensory.: intact to touch ,pinprick .position and vibratory sensation.  Coordination: Rapid alternating movements normal in all extremities. Finger-to-nose and heel-to-shin performed accurately bilaterally. Gait and Station: Arises from chair without difficulty. Stance is normal. Gait demonstrates normal stride length and balance . Able to heel, toe and tandem walk without difficulty.  Reflexes: 1+ and symmetric. Toes downgoing.   NIHSS  0 Modified Rankin 0   ASSESSMENT: 40 year Caucasian male with embolic left MCA branch infarct in September 2017 secondary to small focal proximal left carotid artery dissection. Vascular risk factors of hyperlipidemia and  patent foramen ovale.    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 Repeat CT angiogram done today shows stable dissection without significant stenosis or flow reduction.. I recommend he continue aspirin and Plavix for 6  more months and we will repeat follow-up CT angiogram and that time. I also counseled him not to have endovascular PFO closure at the present time As his focal aortic dissection is more likely cause of his stroke and PFO is incidental finding. and to hold off on closure unless he has recurrent strokes in the future. Advise him to maintain strict control of hypertension with blood pressure goal below 140/90 and lipids with LDL cholesterol goal below 70 mg percent. He was also encouraged to eat a healthy diet and be active and not gain weight. He'll return for follow-up in 6 months or call earlier if necessary Greater than 50% of time during this 25 minute visit was spent on counseling,explanation of diagnosis, planning of further management, discussion with patient and family and coordination of care Delia Heady, MD  [facility] [address] [place], [place] [address]  Phone [phone] Fax [phone] Note: This document was prepared with digital dictation and possible smart phrase technology. Any transcriptional errors that result from this process are unintentional

## 2016-07-15 ENCOUNTER — Telehealth: Payer: Self-pay | Admitting: Neurology

## 2016-07-15 ENCOUNTER — Telehealth: Payer: Self-pay | Admitting: Cardiovascular Disease

## 2016-07-15 MED ORDER — ATORVASTATIN CALCIUM 20 MG PO TABS: 20.0000 mg | ORAL_TABLET | Freq: Every day | ORAL | 1 refills | Status: DC

## 2016-07-15 MED ORDER — CLOPIDOGREL BISULFATE 75 MG PO TABS: 75.0000 mg | ORAL_TABLET | Freq: Every day | ORAL | 1 refills | Status: DC

## 2016-07-15 NOTE — Telephone Encounter (Signed)
I spoke with the pt and made him aware that we will not be able to fill his medications long term. Due to Dr Pearlean Brownie being out of the office we will send in a 30 day supply with 1 refill. At this time the pt will follow-up with Dr Excell Seltzer as needed for PFO (no closure recommended at this time per Neurology).

## 2016-07-15 NOTE — Telephone Encounter (Signed)
New message   Pt verbalized that he is calling for Dr.Cooper to sign off on some medications so he can get a refill, but Dr.Cooper didn't prescribe them. When pt was admitted into the hospital they started him on lipitor 20mg  plavix 75mg  generic for both

## 2016-07-15 NOTE — Telephone Encounter (Addendum)
Rn call patient back about the lipitor and plavix refills. Rn stated Dr. Pearlean Brownie is on vacation this week He will be in the hospital next week doing stroke rounds all day. Rn advised pt to seek his PCP, or cardiologist for refills.Pt stated he will call his PCP or cardiologist for refills.Rn stated Dr. Pearlean Brownie did not do the original refill.Pt will call back if there are any issues. Pt verbalized understanding.

## 2016-07-15 NOTE — Telephone Encounter (Signed)
Patient is requesting new Rx's for atorvastatin (LIPITOR) 20 MG tablet and clopidogrel (PLAVIX) 75 MG tablet called to [facility] in [place].

## 2016-09-27 ENCOUNTER — Other Ambulatory Visit: Payer: Self-pay

## 2016-09-27 ENCOUNTER — Telehealth: Payer: Self-pay | Admitting: Neurology

## 2016-09-27 MED ORDER — CLOPIDOGREL BISULFATE 75 MG PO TABS: 75.0000 mg | ORAL_TABLET | Freq: Every day | ORAL | 1 refills | Status: DC

## 2016-09-27 MED ORDER — ATORVASTATIN CALCIUM 20 MG PO TABS: 20.0000 mg | ORAL_TABLET | Freq: Every day | ORAL | 1 refills | Status: AC

## 2016-09-27 NOTE — Telephone Encounter (Signed)
Patient requesting refill of clopidogrel (PLAVIX) 75 MG tablet and atorvastatin (LIPITOR) 20 MG tablet called to [facility] on [address]

## 2016-09-27 NOTE — Telephone Encounter (Signed)
Refill done for patient until his next appt in June 2018. Pt has follow up appt. Refill sent to pharmacy for 90 day supply.

## 2017-01-02 ENCOUNTER — Encounter: Payer: Self-pay | Admitting: Neurology

## 2017-01-02 ENCOUNTER — Ambulatory Visit (INDEPENDENT_AMBULATORY_CARE_PROVIDER_SITE_OTHER): Payer: 59 | Admitting: Neurology

## 2017-01-02 ENCOUNTER — Encounter (INDEPENDENT_AMBULATORY_CARE_PROVIDER_SITE_OTHER): Payer: Self-pay

## 2017-01-02 VITALS — BP 125/83 | HR 76 | Wt 251.0 lb

## 2017-01-02 DIAGNOSIS — I6990 Unspecified sequelae of unspecified cerebrovascular disease: Secondary | ICD-10-CM | POA: Diagnosis not present

## 2017-01-02 NOTE — Patient Instructions (Signed)
I had a long discussion with the patient regarding his focal left carotid dissection and embolic stroke which is doing quite well. I recommend discontinuing Plavix and stay on aspirin 325 mg daily alone for stroke prevention and maintain aggressive risk factor control with blood pressure goal below 130/90, lipids with LDL cholesterol goal below 70 mg percen permanency goal below 6.5%. I have encouraged the patient to eat a healthy diet with lots of fruits, vegetables, cereals and whole gr and to lose weight. Continue conservative follow-up for his PFO which is likely an incidental finding. Check fasting lipid profile and liver function test. Return for follow-up in a year or call earlier if necessary.

## 2017-01-02 NOTE — Progress Notes (Signed)
[facility] [address] [place]. [place] [address] [phone]       OFFICE FOLLOW-UP NOTE  Hunter. Hunter Wilson Date of Birth:  Sep 20, 1975 Medical Record Number:  [id number]   HPI: Initial visit 07/04/2016 Hunter Wilson is a 50 year Caucasian male seen today for first office follow-up visit following hospital admission for stroke in September 2017. Hunter Collinsis an 41 y.o. male with hx of RA on humara who presents with waking up with R arm tingling and numbness followed by word find difficulties. Date last known well: 04/03/16 Time last known well: last night before bed tPA Given:No: wake up stroke. CT angiogram of the head and neck showed a focal filling defect at the origin of left internal carotid artery concerning for a dissection flap but there was no significant flow limitation no stenosis. MRI scan showed an acute infarct in the left MCA territory in the posterior frontal and parietal region with an occluded left M2 and M3 branches on the MRA. CT perfusion did  show penumbra volume of 62 mL but the patient's distal branch occlusion was felt not amenable to endovascular treatment. Patient did well and his speech improved. He was started on aspirin and Plavix for his focal dissection. He had a known history of hole in the heart and hence transcranial Doppler bubble study was performed which was positive for medium size right to left intracardiac shunt. He subsequently had outpatient transesophageal echocardiogram performed by Dr. Eden Wilson on 05/08/16 which confirmed a small PFO. Patient was seen by Dr. Tonny Wilson in the hospital for PFO closure but the final decision will be made after follow-up CT angiogram which actually had today which I have personally reviewed and shows persistent proximal left carotid artery dissection flap but without thrombus or flow limiting stenosis. Patient does have rheumatoid arthritis but that seems to be fairly controlled on Humira and  methotrexate. He denies high-risk behavior with repetitive neck injuries. He is tolerating his dual antiplatelets with minor bruising and no bleeding. He is taking Lipitor regularly without side effects as well. Update 01/02/2017 ; he returns for follow-up after last visit 6 months ago. He continues to do well without recurrent stroke or TIA symptoms. His starting aspirin and Plavix well with only minor bruising and no bleeding. He remains on Lipitor which he is tolerating it well without myalgias or arthralgias. He is not had lipid profile checked since he left the hospital. His blood pressure is well controlled and today is 125/83. His rheumatoid arthritis is stable and remains on Humira and methotrexate. He has no complaints today. ROS:   14 system review of systems is positive for  no complaints today and all other systems negative PMH:  Past Medical History:  Diagnosis Date  . Acute cerebrovascular accident (CVA) (HCC) 04/04/2016  . Arthritis   . 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 Cerebral infarction due to unspecified mechanism   . 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 Family history of adverse reaction to anesthesia    mom has postop n/v  . Hyperlipidemia 04/05/2016  . Rheumatoid arthritis (HCC) 07/18/2015    Social History:  Social History   Social History  . Marital status: Married    Spouse name: N/A  . Number of children: N/A  . Years of education: N/A   Occupational History  . Not on file.   Social History Main Topics  . Smoking status: Never Smoker  . Smokeless tobacco: Never Used  . Alcohol  use No  . Drug use: No  . Sexual activity: Not on file   Other Topics Concern  . Not on file   Social History Narrative  . No narrative on file    Medications:   Current Outpatient Prescriptions on File Prior to Visit  Medication Sig Dispense Refill  . Adalimumab (HUMIRA) 40 MG/0.8ML PSKT Inject 40 mg into the skin See admin instructions.  Every other Wednesday    . aspirin EC 325 MG EC tablet Take 1 tablet (325 mg total) by mouth daily. 30 tablet 0  . atorvastatin (LIPITOR) 20 MG tablet Take 1 tablet (20 mg total) by mouth daily at 6 PM. 90 tablet 1  . methotrexate (RHEUMATREX) 10 MG tablet Take 25 mg by mouth once a week. Caution: Chemotherapy. Protect from light.      No current facility-administered medications on file prior to visit.     Allergies:   Allergies  Allergen Reactions  . Mold Extract [Trichophyton]     Unknown     Physical Exam General: well developed, well nourished, seated, in no evident distress Head: head normocephalic and atraumatic.  Neck: supple with no carotid or supraclavicular bruits Cardiovascular: regular rate and rhythm, no murmurs Musculoskeletal: no deformity Skin:  no rash/petichiae Vascular:  Normal pulses all extremities Vitals:   01/02/17 1606  BP: 125/83  Pulse: 76   Neurologic Exam Mental Status: Awake and fully alert. Oriented to place and time. Recent and remote memory intact. Attention span, concentration and fund of knowledge appropriate. Mood and affect appropriate.  Cranial Nerves: Fundoscopic exam reveals sharp disc margins. Pupils equal, briskly reactive to light. Extraocular movements full without nystagmus. Visual fields full to confrontation. Hearing intact. Facial sensation intact. Face, tongue, palate moves normally and symmetrically.  Motor: Normal bulk and tone. Normal strength in all tested extremity muscles. Sensory.: intact to touch ,pinprick .position and vibratory sensation.  Coordination: Rapid alternating movements normal in all extremities. Finger-to-nose and heel-to-shin performed accurately bilaterally. Gait and Station: Arises from chair without difficulty. Stance is normal. Gait demonstrates normal stride length and balance . Able to heel, toe and tandem walk without difficulty.  Reflexes: 1+ and symmetric. Toes downgoing.       ASSESSMENT: 45  year Caucasian male with embolic left MCA branch infarct in September 2017 secondary to small focal proximal left carotid artery dissection. Vascular risk factors of hyperlipidemia and  patent foramen ovale.    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  . I recommend he continue aspirin alone and stop Plavix as it has been more than  6 more months of dual antiplatelet therapy since his stroke.. I also counseled him not to have endovascular PFO closure at the present time As his focal aortic dissection is more likely cause of his stroke and PFO is incidental finding. and to hold off on closure unless he has recurrent strokes in the future. Advise him to maintain strict control of hypertension with blood pressure goal below 140/90 and lipids with LDL cholesterol goal below 70 mg percent. He was also encouraged to eat a healthy diet and be active and not gain weight. Check fasting lipid profile and liver function tests He'll return for follow-up in 1 year or call earlier if necessary Greater than 50% of time during this 25 minute visit was spent on counseling,explanation of diagnosis of aortic dissection, embolic stroke, PFO, planning of further  management,   and coordination of care Delia Heady, MD  [facility] [address] [place], [place] [address]  Phone [phone] Fax [phone] Note: This document was prepared with digital dictation and possible smart phrase technology. Any transcriptional errors that result from this process are unintentional

## 2017-03-20 IMAGING — CT CT HEAD CODE STROKE
3 series · 15 of 47 positions shown, 18 images · non-contrast
Comparison: None.

CLINICAL DATA: Code stroke. Right-sided tingling. Speech
difficulty. Stroke.

EXAM:
CT HEAD WITHOUT CONTRAST
TECHNIQUE: Contiguous axial images were obtained from the base of the skull
through the vertex without intravenous contrast.

[Series 2: head 5.0 st · axial · 0.46mm/px · z∈[-155,-20]mm · 9 of 33 slices shown, 12 images]
[im 3/33  brain]
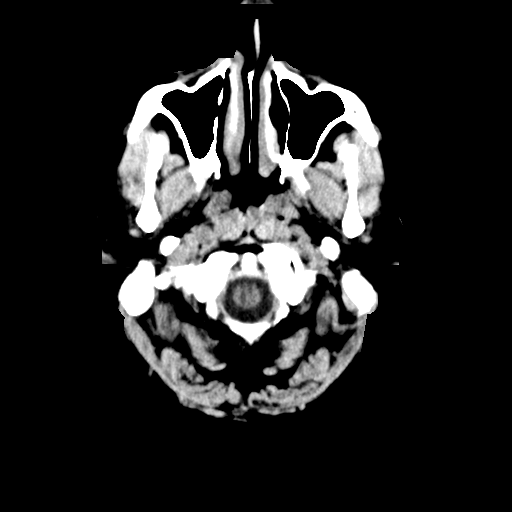
[im 3/33  bone]
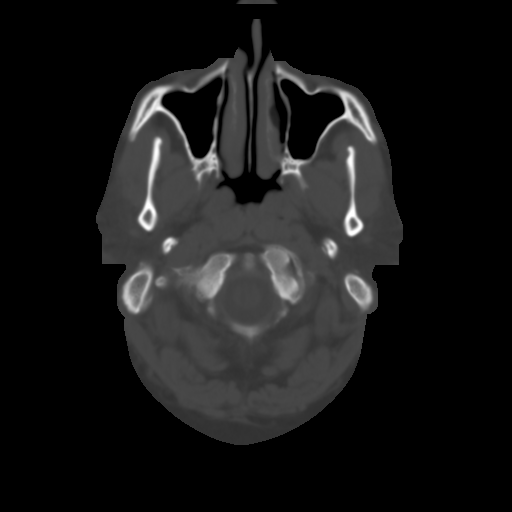
[im 6/33  brain]
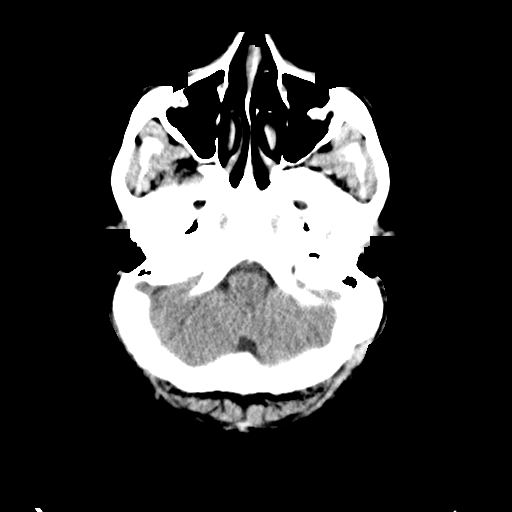
[im 9/33  brain]
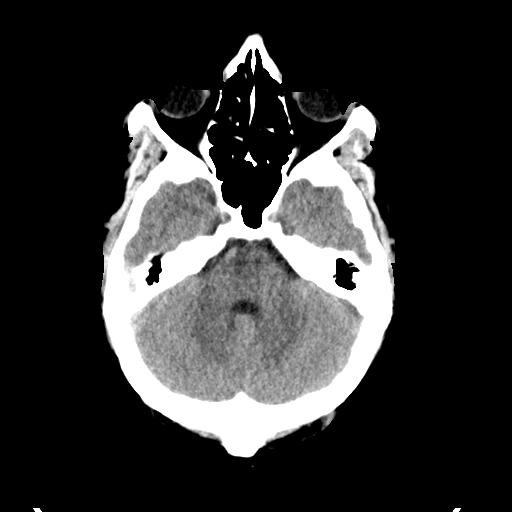
[im 13/33  brain]
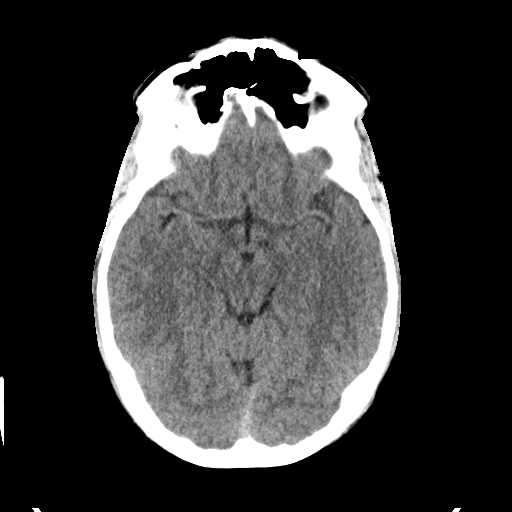
[im 17/33  brain]
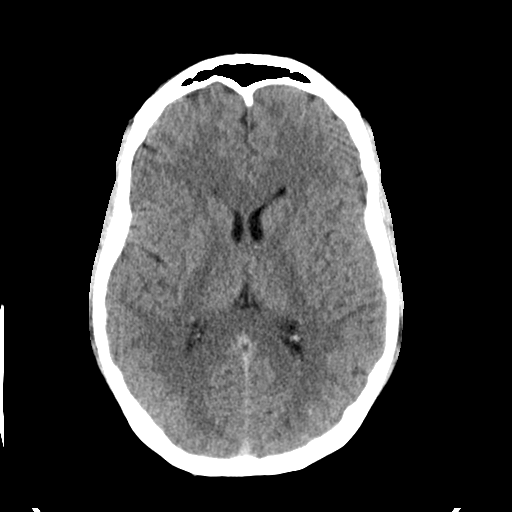
[im 17/33  bone]
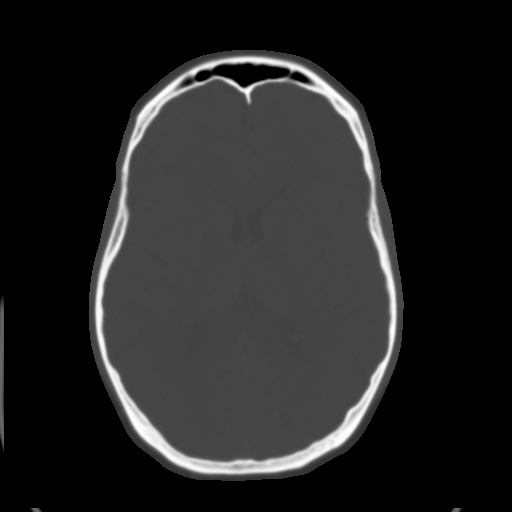
[im 20/33  brain]
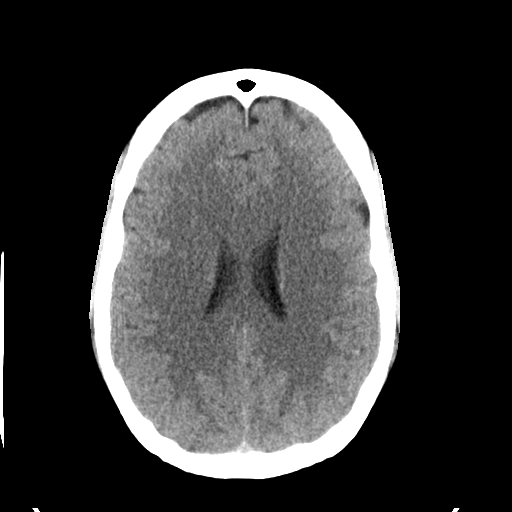
[im 24/33  brain]
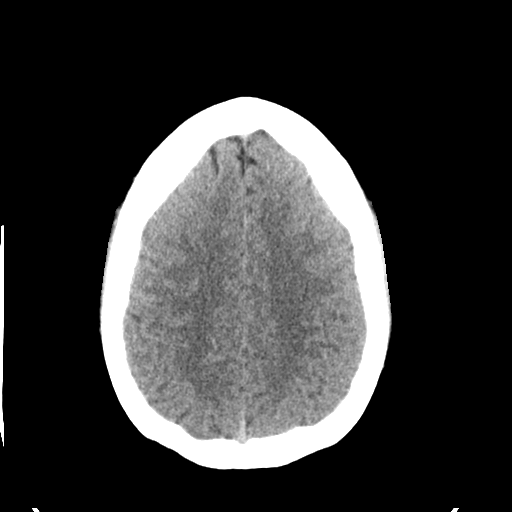
[im 27/33  brain]
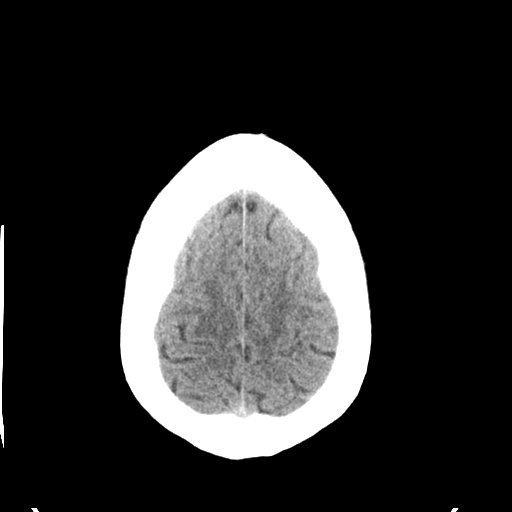
[im 30/33  brain]
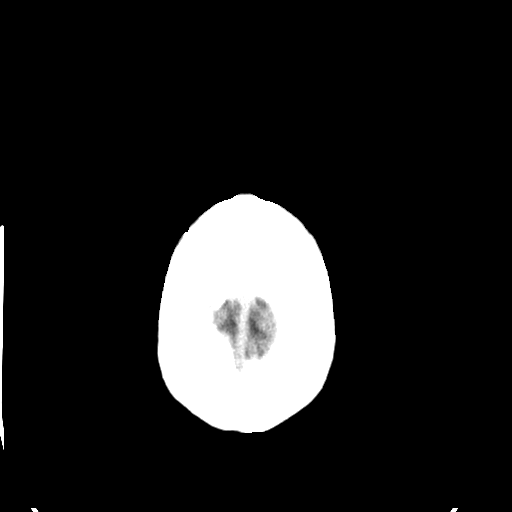
[im 30/33  bone]
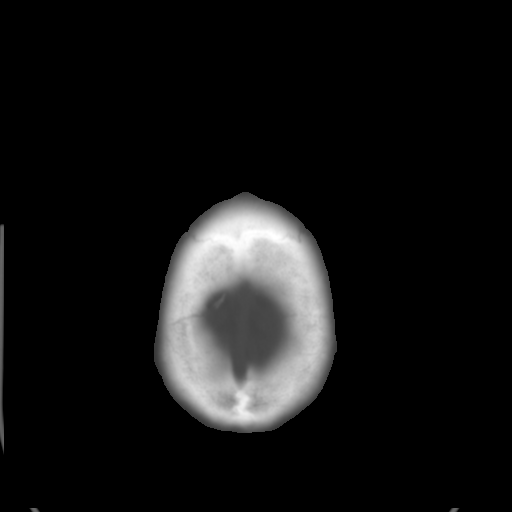

[Series 4: head 3.0 cor st · coronal · 0.32mm/px · 3 of 74 slices shown]
[im 29/74  brain]
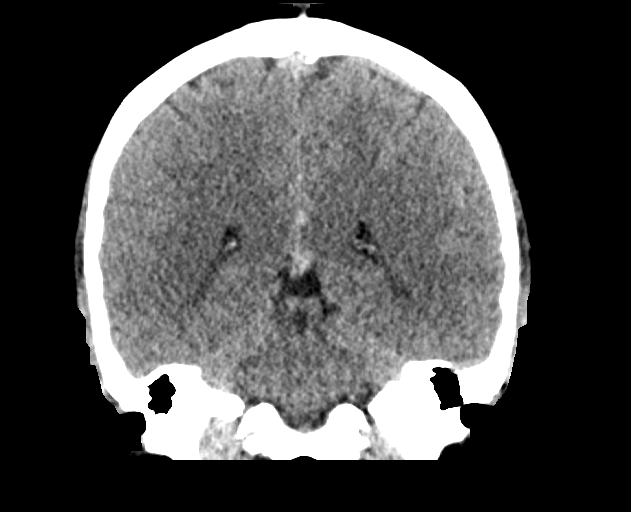
[im 34/74  brain]
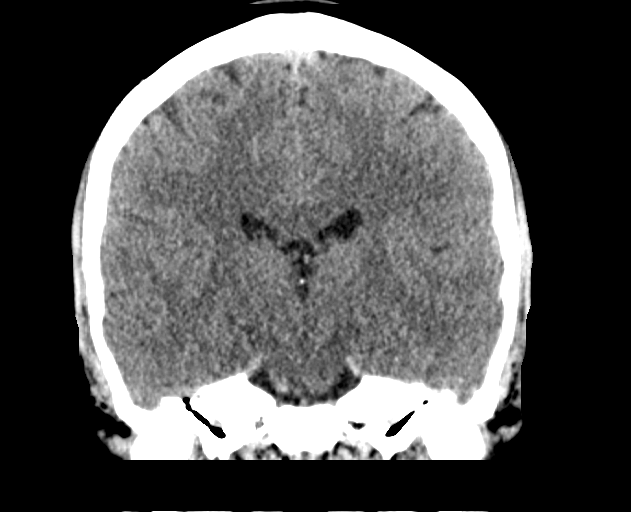
[im 40/74  brain]
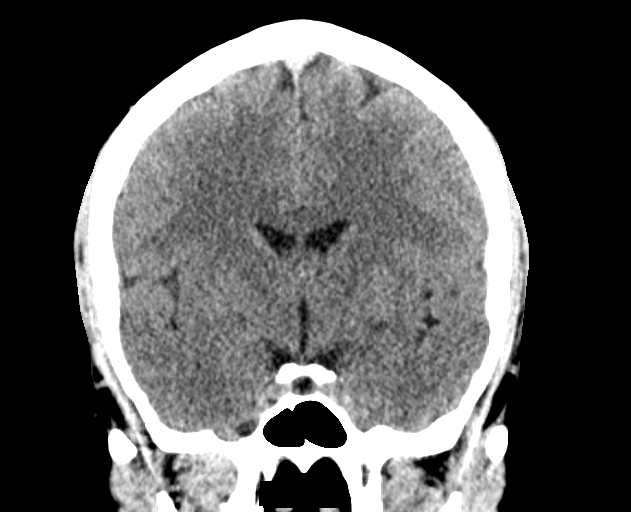

[Series 5: head 3.0 sag st · sagittal · 0.34mm/px · 3 of 61 slices shown]
[im 21/61  brain]
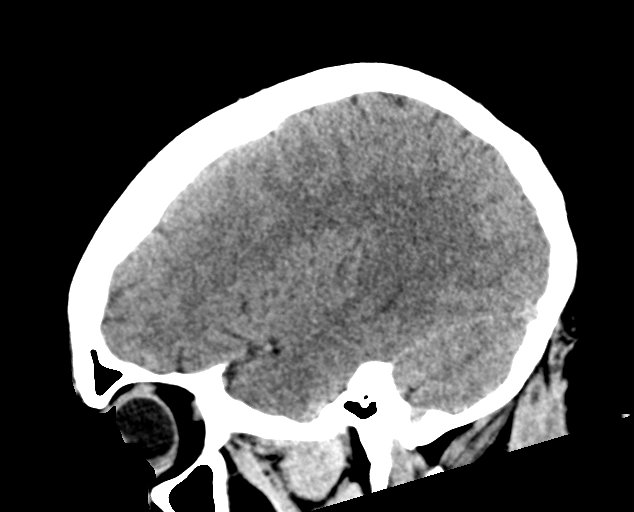
[im 31/61  brain]
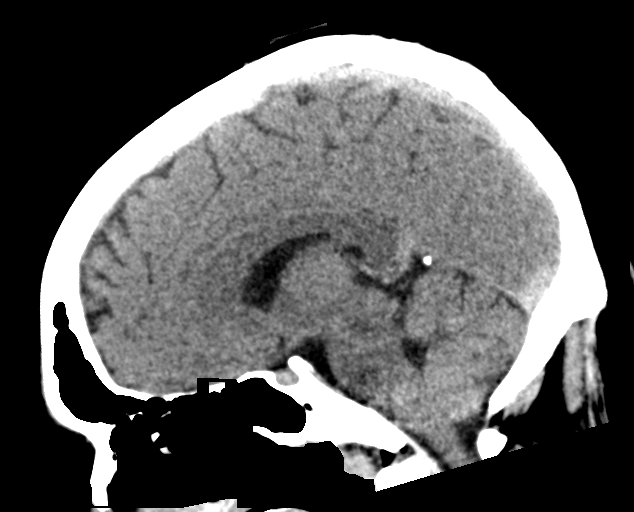
[im 41/61  brain]
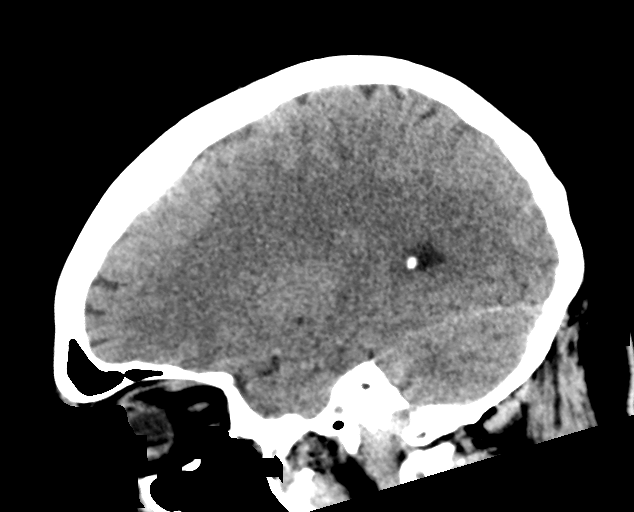

[15 of 47 positions shown; findings below may reference images not displayed]

FINDINGS: Ventricle size normal. Negative for acute infarct. Negative for
hemorrhage or mass. No edema identified.

Negative calvarium.

ASPECTS (Alberta Stroke Program Early CT Score)

- Ganglionic level infarction (caudate, lentiform nuclei, internal
capsule, insula, M1-M3 cortex): 7

- Supraganglionic infarction (M4-M6 cortex): 3

Total score (0-10 with 10 being normal): 10
IMPRESSION: 1. Negative CT head
2. ASPECTS is 10

These results were called by telephone at the time of interpretation
on 04/04/2016 at [DATE] to Dr. Giorgi, who verbally acknowledged
these results.

## 2017-12-29 ENCOUNTER — Ambulatory Visit: Payer: Self-pay | Admitting: Neurology

## 2019-06-04 ENCOUNTER — Ambulatory Visit: Payer: 59 | Admitting: Cardiovascular Disease
